# Patient Record
Sex: Female | Born: 2000 | Race: White | Hispanic: No | Smoking: Current every day smoker
Health system: Southern US, Community
[De-identification: ages and names within clinical notes are randomized; demographics above are authoritative.]

## PROBLEM LIST (undated history)

## (undated) ENCOUNTER — Emergency Department (HOSPITAL_COMMUNITY): Payer: Self-pay

## (undated) DIAGNOSIS — F909 Attention-deficit hyperactivity disorder, unspecified type: Secondary | ICD-10-CM

## (undated) DIAGNOSIS — L309 Dermatitis, unspecified: Secondary | ICD-10-CM

## (undated) DIAGNOSIS — F32A Depression, unspecified: Secondary | ICD-10-CM

## (undated) DIAGNOSIS — R6881 Early satiety: Secondary | ICD-10-CM

## (undated) DIAGNOSIS — F603 Borderline personality disorder: Secondary | ICD-10-CM

## (undated) DIAGNOSIS — F419 Anxiety disorder, unspecified: Secondary | ICD-10-CM

## (undated) DIAGNOSIS — R51 Headache: Secondary | ICD-10-CM

## (undated) DIAGNOSIS — R894 Abnormal immunological findings in specimens from other organs, systems and tissues: Secondary | ICD-10-CM

## (undated) DIAGNOSIS — F319 Bipolar disorder, unspecified: Secondary | ICD-10-CM

## (undated) DIAGNOSIS — T7840XA Allergy, unspecified, initial encounter: Secondary | ICD-10-CM

## (undated) DIAGNOSIS — J302 Other seasonal allergic rhinitis: Secondary | ICD-10-CM

## (undated) DIAGNOSIS — H539 Unspecified visual disturbance: Secondary | ICD-10-CM

## (undated) DIAGNOSIS — R110 Nausea: Secondary | ICD-10-CM

## (undated) DIAGNOSIS — F329 Major depressive disorder, single episode, unspecified: Secondary | ICD-10-CM

## (undated) DIAGNOSIS — F410 Panic disorder [episodic paroxysmal anxiety] without agoraphobia: Secondary | ICD-10-CM

## (undated) DIAGNOSIS — R109 Unspecified abdominal pain: Secondary | ICD-10-CM

## (undated) HISTORY — DX: Early satiety: R68.81

## (undated) HISTORY — DX: Bipolar disorder, unspecified: F31.9

## (undated) HISTORY — DX: Panic disorder (episodic paroxysmal anxiety): F41.0

## (undated) HISTORY — DX: Nausea: R11.0

## (undated) HISTORY — DX: Borderline personality disorder: F60.3

## (undated) HISTORY — PX: WISDOM TOOTH EXTRACTION: SHX21

## (undated) HISTORY — PX: ADENOIDECTOMY: SUR15

## (undated) HISTORY — DX: Unspecified abdominal pain: R10.9

## (undated) HISTORY — DX: Abnormal immunological findings in specimens from other organs, systems and tissues: R89.4

## (undated) HISTORY — PX: TONSILLECTOMY: SUR1361

---
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
## 2018-08-18 ENCOUNTER — Emergency Department (HOSPITAL_COMMUNITY)
Admission: EM | Admit: 2018-08-18 | Discharge: 2018-08-18 | Disposition: A | Discharge status: Home / Self Care | Payer: Medicaid Other | Attending: Emergency Medicine | Admitting: Emergency Medicine

## 2018-08-18 ENCOUNTER — Encounter (HOSPITAL_COMMUNITY): Payer: Self-pay | Admitting: Emergency Medicine

## 2018-08-18 ENCOUNTER — Other Ambulatory Visit: Payer: Self-pay

## 2018-08-18 DIAGNOSIS — F909 Attention-deficit hyperactivity disorder, unspecified type: Secondary | ICD-10-CM | POA: Insufficient documentation

## 2018-08-18 DIAGNOSIS — N938 Other specified abnormal uterine and vaginal bleeding: Secondary | ICD-10-CM | POA: Insufficient documentation

## 2018-08-18 DIAGNOSIS — F17210 Nicotine dependence, cigarettes, uncomplicated: Secondary | ICD-10-CM | POA: Insufficient documentation

## 2018-08-18 DIAGNOSIS — Z7982 Long term (current) use of aspirin: Secondary | ICD-10-CM | POA: Insufficient documentation

## 2018-08-18 DIAGNOSIS — N939 Abnormal uterine and vaginal bleeding, unspecified: Secondary | ICD-10-CM

## 2018-08-18 DIAGNOSIS — Z79899 Other long term (current) drug therapy: Secondary | ICD-10-CM | POA: Insufficient documentation

## 2018-08-18 LAB — PREGNANCY, URINE: Preg Test, Ur: NEGATIVE

## 2018-08-18 MED ORDER — MEDROXYPROGESTERONE ACETATE 5 MG PO TABS: 5.0000 mg | ORAL_TABLET | Freq: Every day | ORAL | 0 refills | Status: DC

## 2018-08-18 NOTE — ED Notes (Signed)
Patient is getting into a gown call bell in reach 

## 2018-08-18 NOTE — ED Notes (Signed)
Patient verbalizes understanding of discharge instructions. Opportunity for questioning and answers were provided. Armband removed by staff, pt discharged from ED.  

## 2018-08-18 NOTE — ED Triage Notes (Signed)
Pt here for vaginal bleeding on and of since 3/10. Pt states she had negative preg test then. Pt states not on BC, last normal period 6 months ago.

## 2018-08-19 NOTE — ED Provider Notes (Signed)
[facility] EMERGENCY DEPARTMENT Provider Note   CSN: [id number] Arrival date & time: 08/18/18  1133    History   Chief Complaint Chief Complaint  Patient presents with  . Vaginal Bleeding    HPI Mary Crane is a 17 y.o. female.     HPI   18yF with vaginal bleeding. Has been intermittent for weeks. About the same to less than a normal period typically. Not on BC. Some pelvic cramping. No discharge. No urinary complaints. Doesn't currently have gyn.   Past Medical History:  Diagnosis Date  . Abdominal pain   . Abdominal pain   . Abnormal celiac antibody panel   . ADHD (attention deficit hyperactivity disorder)   . ADHD (attention deficit hyperactivity disorder)    Hx: ADHD  . Allergy   . Anxiety   . Depression   . Early satiety   . Eczema    bilateral legs  . Headache(784.0)   . Nausea   . Nausea   . Seasonal allergies   . Vision abnormalities    Hx: of wears glasses    Patient Active Problem List   Diagnosis Date Noted  . 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 ADHD (attention deficit hyperactivity disorder) 01/19/2014  . Gluten intolerance 08/07/2013  . Abnormal celiac antibody panel     Past Surgical History:  Procedure Laterality Date  . ADENOIDECTOMY    . ESOPHAGOGASTRODUODENOSCOPY N/A 06/14/2013   Procedure: ESOPHAGOGASTRODUODENOSCOPY (EGD);  Surgeon: Jon Gills, MD;  Location: [facility] OR;  Service: Gastroenterology;  Laterality: N/A;  . TONSILLECTOMY    . WISDOM TOOTH EXTRACTION       OB History    Gravida  0   Para  0   Term  0   Preterm  0   AB  0   Living  0     SAB  0   TAB  0   Ectopic  0   Multiple  0   Live Births  0            Home Medications    Prior to Admission medications   Medication Sig Start Date End Date Taking? Authorizing Provider  aspirin-acetaminophen-caffeine (EXCEDRIN MIGRAINE) [phone] MG tablet Take 1 tablet by mouth every 6 (six) hours as needed for headache or  migraine.     [provider]  EPINEPHrine (EPIPEN 2-PAK) 0.3 mg/0.3 mL IJ SOAJ injection Inject 0.3 mg into the muscle once as needed (for allergic reaction).     [provider]  ibuprofen (ADVIL,MOTRIN) 200 MG tablet Take 400-600 mg by mouth every 6 (six) hours as needed (for pain).    [provider]  medroxyPROGESTERone (DEPO-PROVERA) 150 MG/ML injection Inject 1 mL (150 mg total) into the muscle every 3 (three) months. 12/20/16   Tresea Mall, CNM  medroxyPROGESTERone (PROVERA) 5 MG tablet Take 1 tablet (5 mg total) by mouth daily. 08/18/18   Raeford Razor, MD  methocarbamol (ROBAXIN) 500 MG tablet Take 1 tablet (500 mg total) by mouth 2 (two) times daily. Patient not taking: Reported on 08/23/2017 07/30/17   Dartha Lodge, PA-C  metroNIDAZOLE (FLAGYL) 500 MG tablet Take 1 tablet (500 mg total) by mouth 2 (two) times daily. Patient not taking: Reported on 07/30/2017 02/02/17   Liberty Handy, PA-C  naproxen (NAPROSYN) 500 MG tablet Take 1 tablet (500 mg total) by mouth 2 (two) times daily with a meal. 06/11/17   Muthersbaugh, Dahlia Client, PA-C  traMADol (ULTRAM) 50 MG tablet  Take 1 tablet (50 mg total) by mouth every 6 (six) hours as needed. 08/21/17   Liberty HandyGibbons, Claudia J, PA-C    Family History Family History  Problem Relation Age of Onset  . Cholelithiasis Maternal Grandmother   . Hypertension Maternal Grandmother   . Hyperlipidemia Maternal Grandmother   . Thyroid disease Maternal Grandmother   . Cholelithiasis Maternal Grandfather   . Hypertension Maternal Grandfather   . Celiac disease Neg Hx   . Hyperlipidemia Mother   . Thyroid disease Mother     Social History Social History   Tobacco Use  . Smoking status: Current Every Day Smoker    Packs/day: 0.50    Years: 1.50    Pack years: 0.75    Types: Cigarettes  . Smokeless tobacco: Never Used  Substance Use Topics  . Alcohol use: No  . Drug use: No     Allergies   Azithromycin; Augmentin  [amoxicillin-pot clavulanate]; and Gluten meal   Review of Systems Review of Systems  All systems reviewed and negative, other than as noted in HPI. Physical Exam Updated Vital Signs BP 134/90 (BP Location: Right Arm)   Pulse 92   Temp 98.6 F (37 C) (Oral)   Resp 20   SpO2 99%   Physical Exam Vitals signs and nursing note reviewed.  Constitutional:      General: She is not in acute distress.    Appearance: She is well-developed.  HENT:     Head: Normocephalic and atraumatic.  Eyes:     General:        Right eye: No discharge.        Left eye: No discharge.     Conjunctiva/sclera: Conjunctivae normal.  Neck:     Musculoskeletal: Neck supple.  Cardiovascular:     Rate and Rhythm: Normal rate and regular rhythm.     Heart sounds: Normal heart sounds. No murmur. No friction rub. No gallop.   Pulmonary:     Effort: Pulmonary effort is normal. No respiratory distress.     Breath sounds: Normal breath sounds.  Abdominal:     General: There is no distension.     Palpations: Abdomen is soft.     Tenderness: There is no abdominal tenderness.  Musculoskeletal:        General: No tenderness.  Skin:    General: Skin is warm and dry.  Neurological:     Mental Status: She is alert.  Psychiatric:        Behavior: Behavior normal.        Thought Content: Thought content normal.      ED Treatments / Results  Labs (all labs ordered are listed, but only abnormal results are displayed) Labs Reviewed  PREGNANCY, URINE    EKG None  Radiology No results found.  Procedures Procedures (including critical care time)  Medications Ordered in ED Medications - No data to display   Initial Impression / Assessment and Plan / ED Course  I have reviewed the triage vital signs and the nursing notes.  Pertinent labs & imaging results that were available during my care of the patient were reviewed by me and considered in my medical decision making (see chart for details).         Will try on provera. Benign exam. Not pregnant. Needs to establish with GYN. It has been determined that no acute conditions requiring further emergency intervention are present at this time. The patient has been advised of the diagnosis and plan. I reviewed  any labs and imaging including any potential incidental findings. I have reviewed nursing notes and appropriate previous records. We have discussed signs and symptoms that warrant return to the ED and they are listed in the discharge instructions.      Final Clinical Impressions(s) / ED Diagnoses   Final diagnoses:  Vaginal bleeding    ED Discharge Orders         Ordered    medroxyPROGESTERone (PROVERA) 5 MG tablet  Daily,   Status:  Discontinued     08/18/18 1251    medroxyPROGESTERone (PROVERA) 5 MG tablet  Daily     08/18/18 1251           Raeford Razor, MD 08/19/18 1755
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
## 2019-09-01 ENCOUNTER — Emergency Department (HOSPITAL_COMMUNITY)
Admission: EM | Admit: 2019-09-01 | Discharge: 2019-09-01 | Disposition: A | Discharge status: Home / Self Care | Payer: Medicaid Other | Attending: Emergency Medicine | Admitting: Emergency Medicine

## 2019-09-01 ENCOUNTER — Encounter (HOSPITAL_COMMUNITY): Payer: Self-pay | Admitting: Emergency Medicine

## 2019-09-01 ENCOUNTER — Other Ambulatory Visit: Payer: Self-pay

## 2019-09-01 DIAGNOSIS — Z5321 Procedure and treatment not carried out due to patient leaving prior to being seen by health care provider: Secondary | ICD-10-CM | POA: Insufficient documentation

## 2019-09-01 DIAGNOSIS — M79645 Pain in left finger(s): Secondary | ICD-10-CM | POA: Insufficient documentation

## 2019-09-01 NOTE — ED Triage Notes (Signed)
Pt states she bent acrylic nail on L thumb back when she lost the grip on a couch she was moving.  C/o bleeding around nail bed.

## 2019-09-02 DIAGNOSIS — R894 Abnormal immunological findings in specimens from other organs, systems and tissues: Secondary | ICD-10-CM | POA: Insufficient documentation

## 2020-03-19 ENCOUNTER — Encounter: Payer: Medicaid Other | Admitting: Obstetrics and Gynecology

## 2020-03-19 ENCOUNTER — Other Ambulatory Visit: Payer: Self-pay

## 2020-03-19 ENCOUNTER — Ambulatory Visit (INDEPENDENT_AMBULATORY_CARE_PROVIDER_SITE_OTHER): Payer: Medicaid Other

## 2020-03-19 DIAGNOSIS — N926 Irregular menstruation, unspecified: Secondary | ICD-10-CM | POA: Diagnosis not present

## 2020-03-19 LAB — POCT URINE PREGNANCY: Preg Test, Ur: POSITIVE — AB

## 2020-03-19 NOTE — Progress Notes (Signed)
Patient was originally scheduled with [facility] for irregular cycles. Patient mentioned she had a positive UPT at home this morning. Did a UPT in office and was positive. Appointment with [facility] cancelled (per [facility]) and pt to schedule NOB visit.

## 2020-03-20 NOTE — Progress Notes (Signed)
This encounter was created in error - please disregard.

## 2020-03-20 NOTE — Patient Instructions (Signed)
I value your feedback and entrusting us with your care. If you get a [facility] patient survey, I would appreciate you taking the time to let us know about your experience today. Thank you!  As of April 25, 2019, your lab results will be released to your MyChart immediately, before I even have a chance to see them. Please give me time to review them and contact you if there are any abnormalities. Thank you for your patience.  

## 2020-04-06 ENCOUNTER — Encounter: Payer: Self-pay | Admitting: Obstetrics

## 2020-04-06 ENCOUNTER — Other Ambulatory Visit: Payer: Self-pay

## 2020-04-06 ENCOUNTER — Other Ambulatory Visit (HOSPITAL_COMMUNITY)
Admission: RE | Admit: 2020-04-06 | Discharge: 2020-04-06 | Disposition: A | Discharge status: Home / Self Care | Payer: Medicaid Other | Source: Ambulatory Visit | Attending: Obstetrics | Admitting: Obstetrics

## 2020-04-06 ENCOUNTER — Ambulatory Visit (INDEPENDENT_AMBULATORY_CARE_PROVIDER_SITE_OTHER): Payer: Medicaid Other | Admitting: Obstetrics

## 2020-04-06 VITALS — BP 120/60 | Wt 144.0 lb

## 2020-04-06 DIAGNOSIS — O0990 Supervision of high risk pregnancy, unspecified, unspecified trimester: Secondary | ICD-10-CM | POA: Insufficient documentation

## 2020-04-06 DIAGNOSIS — Z3A12 12 weeks gestation of pregnancy: Secondary | ICD-10-CM | POA: Diagnosis not present

## 2020-04-06 DIAGNOSIS — Z3401 Encounter for supervision of normal first pregnancy, first trimester: Secondary | ICD-10-CM

## 2020-04-06 DIAGNOSIS — F1290 Cannabis use, unspecified, uncomplicated: Secondary | ICD-10-CM

## 2020-04-06 HISTORY — DX: Supervision of high risk pregnancy, unspecified, unspecified trimester: O09.90

## 2020-04-06 LAB — POCT URINALYSIS DIPSTICK OB
Glucose, UA: NEGATIVE
POC,PROTEIN,UA: NEGATIVE

## 2020-04-06 LAB — POCT URINE PREGNANCY: Preg Test, Ur: POSITIVE — AB

## 2020-04-06 NOTE — Progress Notes (Signed)
C/O irreg periods; coming off seroquel and side effects while preg.rj

## 2020-04-06 NOTE — Progress Notes (Signed)
New Obstetric Patient H&P    Chief Complaint: "Desires prenatal care"   History of Present Illness: Patient is a 19 y.o. G1P0000 Not Hispanic or Latino female, LMP uncertain presents with amenorrhea and positive home pregnancy test. Based on her  LMP, her EDD is Estimated Date of Delivery: 10/15/20 and her EGA is [redacted]w[redacted]d Cycles are 5. days, irregular, and occur approximately every : not applicable days. Her last pap smear was uncertain, and she is not sure whether she has ever had a pap.    She had a urine pregnancy test which was positive about 4 week(s)  ago. Her last menstrual period was normal and lasted for  about 4 day(s). Since her LMP she claims she has experienced nausea, breast tenderness.. She denies vaginal bleeding. Her past medical history is noncontributory. Her prior pregnancies are notable for none  Since her LMP, she admits to the use of tobacco products  Yes- She is trying to cut bac , but smokes 1/2 ppd, and also smoked MJ yesterday. She claims she has gained   no pounds since the start of her pregnancy.  There are cats in the home in the home  yes If yes Indoor She admits close contact with children on a regular basis  no  She has had chicken pox in the past no She has had Tuberculosis exposures, symptoms, or previously tested positive for TB   no Current or past history of domestic violence. no  Genetic Screening/Teratology Counseling: (Includes patient, baby's father, or anyone in either family with:)   [facility] 2. Thalassemia ([place] or [place] background): MCV<80  no 3. Neural tube defect (meningomyelocele, spina bifida, anencephaly)  no 4. Congenital heart defect  no  5. Down syndrome  no 6. Tay-Sachs ([place], [place]  no 7. Canavan's Disease  no 8. Sickle cell disease or trait (African)  no  9. Hemophilia or other blood disorders  no  10. Muscular dystrophy  no  11. Cystic fibrosis  no  12.  Huntington's Chorea  no  13. Mental retardation/autism  no 14. Other inherited genetic or chromosomal disorder  no 15. Maternal metabolic disorder (DM, PKU, etc)  no 16. Patient or FOB with a child with a birth defect not listed above no  16a. Patient or FOB with a birth defect themselves no 17. Recurrent pregnancy loss, or stillbirth  no  18. Any medications since LMP other than prenatal vitamins (include vitamins, supplements, OTC meds, drugs, alcohol)  Yes. She takes daily Seroquel and Lamictal. Recently tried to come off of the Seroquel, and had trrible side effects. 19. Any other genetic/environmental exposure to discuss  no  Infection History:   1. Lives with someone with TB or TB exposed  no  2. Patient or partner has history of genital herpes  She is uncertain. Would like testing for this. 3. Rash or viral illness since LMP  no 4. History of STI (GC, CT, HPV, syphilis, HIV)  no 5. History of recent travel :  no  Other pertinent information:  yes . Extensive psych hx- presently under care of an FNP for medication. Is seeking help from psychiatry. We will refer her to MFM for a consultation per her request.    Review of Systems:10 point review of systems negative unless otherwise noted in HPI  Past Medical History:  Past Medical History:  Diagnosis Date  . Abdominal pain   .  Abdominal pain   . Abnormal celiac antibody panel   . ADHD (attention deficit hyperactivity disorder)   . ADHD (attention deficit hyperactivity disorder)    Hx: ADHD  . Allergy   . Anxiety   . Bipolar depression ([place])   . Borderline personality disorder ([place])   . Depression   . Early satiety   . Eczema    bilateral legs  . Headache(784.0)   . Nausea   . Nausea   . Panic disorder   . Seasonal allergies   . Vision abnormalities    Hx: of wears glasses    Past Surgical History:  Past Surgical History:  Procedure Laterality Date  . ADENOIDECTOMY    . ESOPHAGOGASTRODUODENOSCOPY N/A 06/14/2013    Procedure: ESOPHAGOGASTRODUODENOSCOPY (EGD);  Surgeon: Oletha Blend, MD;  Location: [facility];  Service: Gastroenterology;  Laterality: N/A;  . TONSILLECTOMY    . WISDOM TOOTH EXTRACTION      Gynecologic History: Patient's last menstrual period was 01/09/2020 (exact date).  Obstetric History: G1P0000  Family History:  Family History  Problem Relation Age of Onset  . Cholelithiasis Maternal Grandmother   . Hypertension Maternal Grandmother   . Hyperlipidemia Maternal Grandmother   . Thyroid disease Maternal Grandmother   . Cholelithiasis Maternal Grandfather   . Hypertension Maternal Grandfather   . Celiac disease Neg Hx   . Hyperlipidemia Mother   . Thyroid disease Mother     Social History:  Social History   Socioeconomic History  . Marital status: Single    Spouse name: Not on file  . Number of children: Not on file  . Years of education: Not on file  . Highest education level: Not on file  Occupational History  . Not on file  Tobacco Use  . Smoking status: Current Every Day Smoker    Packs/day: 0.50    Years: 1.50    Pack years: 0.75    Types: Cigarettes  . Smokeless tobacco: Never Used  Vaping Use  . Vaping Use: Former  Substance and Sexual Activity  . Alcohol use: No  . Drug use: Yes    Types: Marijuana  . Sexual activity: Yes    Birth control/protection: None  Other Topics Concern  . Not on file  Social History Narrative   ** Merged History Encounter **       9th grade 2014-2015   Social Determinants of Health   Financial Resource Strain:   . Difficulty of Paying Living Expenses: Not on file  Food Insecurity:   . Worried About Charity fundraiser in the Last Year: Not on file  . Ran Out of Food in the Last Year: Not on file  Transportation Needs:   . Lack of Transportation (Medical): Not on file  . Lack of Transportation (Non-Medical): Not on file  Physical Activity:   . Days of Exercise per Week: Not on file  . Minutes of Exercise per  Session: Not on file  Stress:   . Feeling of Stress : Not on file  Social Connections:   . Frequency of Communication with Friends and Family: Not on file  . Frequency of Social Gatherings with Friends and Family: Not on file  . Attends Religious Services: Not on file  . Active Member of Clubs or Organizations: Not on file  . Attends Archivist Meetings: Not on file  . Marital Status: Not on file  Intimate Partner Violence:   . Fear of Current or Ex-Partner: Not on file  .  Emotionally Abused: Not on file  . Physically Abused: Not on file  . Sexually Abused: Not on file    Allergies:  Allergies  Allergen Reactions  . Azithromycin Anaphylaxis, Hives, Other (See Comments) and Itching    Trouble breathing Trouble breathing  . Augmentin [Amoxicillin-Pot Clavulanate] Hives  . Gluten Meal Nausea And Vomiting    Medications: Prior to Admission medications   Medication Sig Start Date End Date Taking? Authorizing Provider  acetaminophen (TYLENOL) 500 MG tablet Take 500 mg by mouth as needed.   Yes [provider]  lamoTRIgine (LAMICTAL ODT) 25 & 50 & 100 MG KIT Take 50 mg by mouth daily.   Yes [provider]  QUEtiapine (SEROQUEL) 300 MG tablet Take 1 tablet by mouth daily.   Yes [provider]  aspirin-acetaminophen-caffeine (EXCEDRIN MIGRAINE) [phone] MG tablet Take 1 tablet by mouth every 6 (six) hours as needed for headache or migraine.  Patient not taking: Reported on 04/06/2020    [provider]  ibuprofen (ADVIL,MOTRIN) 200 MG tablet Take 400-600 mg by mouth every 6 (six) hours as needed (for pain). Patient not taking: Reported on 04/06/2020    [provider]  medroxyPROGESTERone (DEPO-PROVERA) 150 MG/ML injection Inject 1 mL (150 mg total) into the muscle every 3 (three) months. Patient not taking: Reported on 04/06/2020 12/20/16   Rod Can, CNM  medroxyPROGESTERone (PROVERA) 5 MG tablet Take 1 tablet (5 mg total)  by mouth daily. Patient not taking: Reported on 04/06/2020 08/18/18   Virgel Manifold, MD  methocarbamol (ROBAXIN) 500 MG tablet Take 1 tablet (500 mg total) by mouth 2 (two) times daily. Patient not taking: Reported on 08/23/2017 07/30/17   Jacqlyn Larsen, PA-C  metroNIDAZOLE (FLAGYL) 500 MG tablet Take 1 tablet (500 mg total) by mouth 2 (two) times daily. Patient not taking: Reported on 07/30/2017 02/02/17   Kinnie Feil, PA-C  naproxen (NAPROSYN) 500 MG tablet Take 1 tablet (500 mg total) by mouth 2 (two) times daily with a meal. Patient not taking: Reported on 04/06/2020 06/11/17   Muthersbaugh, Jarrett Soho, PA-C  traMADol (ULTRAM) 50 MG tablet Take 1 tablet (50 mg total) by mouth every 6 (six) hours as needed. Patient not taking: Reported on 04/06/2020 08/21/17   Kinnie Feil, PA-C    Physical Exam Vitals: Blood pressure 120/60, weight 144 lb (65.3 kg), last menstrual period 01/09/2020.  General: NAD HEENT: normocephalic, anicteric Thyroid: no enlargement, no palpable nodules Pulmonary: No increased work of breathing, CTAB Cardiovascular: RRR, distal pulses 2+ Abdomen: NABS, soft, non-tender, non-distended.  Umbilicus without lesions.  No hepatomegaly, splenomegaly or masses palpable. No evidence of hernia  Genitourinary:  External: Normal external female genitalia.  Normal urethral meatus, normal  Bartholin's and Skene's glands.    Vagina: Normal vaginal mucosa, no evidence of prolapse.    Cervix: Grossly normal in appearance, no bleeding  Uterus: enlarged, mobile, normal contour.  No CMT  Adnexa: ovaries non-enlarged, no adnexal masses  Rectal: deferred Extremities: no edema, erythema, or tenderness Neurologic: Grossly intact Psychiatric: mood appropriate, affect full   Assessment: 19 y.o. G1P0000 at 22w4dpresenting to initiate prenatal care  Plan: 1) Avoid alcoholic beverages. 2) Patient encouraged not to smoke.  3) Discontinue the use of all non-medicinal drugs and  chemicals.  4) Take prenatal vitamins daily.  5) Nutrition, food safety (fish, cheese advisories, and high nitrite foods) and exercise discussed. 6) Hospital and practice style discussed with cross coverage system.  7) Genetic Screening, such as with 1st  Trimester Screening, cell free fetal DNA, AFP testing, and Ultrasound, as well as with amniocentesis and CVS as appropriate, is discussed with patient. At the conclusion of today's visit patient undecided genetic testing. She would desie the MaternT testing if offered. Printed information provided. 8) Patient is asked about travel to areas at risk for the Zika virus, and counseled to avoid travel and exposure to mosquitoes or sexual partners who may have themselves been exposed to the virus. Testing is discussed, and will be ordered as appropriate.   Strongly advised to quite smoking MJ and cigarettes. Described the risks to her baby (very desired pregnancy) from tobacco and unregulated MJ. Lengthy discussion around her  Psych medication. Her Seroquel is Cat C, and we discussed this. I have told her to continue this medication, and we can consult with MFM.  Placed her on the High Risk list.

## 2020-04-07 LAB — CYTOLOGY - PAP
Chlamydia: NEGATIVE
Comment: NEGATIVE
Comment: NEGATIVE
Comment: NORMAL
Diagnosis: NEGATIVE
Neisseria Gonorrhea: NEGATIVE
Trichomonas: NEGATIVE

## 2020-04-08 LAB — URINE CULTURE: Organism ID, Bacteria: NO GROWTH

## 2020-04-13 LAB — TOXASSURE SELECT 13 (MW), URINE

## 2020-04-14 ENCOUNTER — Encounter: Payer: Self-pay | Admitting: Obstetrics

## 2020-04-14 DIAGNOSIS — F1290 Cannabis use, unspecified, uncomplicated: Secondary | ICD-10-CM | POA: Insufficient documentation

## 2020-04-20 ENCOUNTER — Ambulatory Visit (INDEPENDENT_AMBULATORY_CARE_PROVIDER_SITE_OTHER): Payer: Medicaid Other

## 2020-04-20 ENCOUNTER — Other Ambulatory Visit: Payer: Medicaid Other

## 2020-04-20 ENCOUNTER — Other Ambulatory Visit: Payer: Self-pay | Admitting: Obstetrics

## 2020-04-20 ENCOUNTER — Ambulatory Visit (INDEPENDENT_AMBULATORY_CARE_PROVIDER_SITE_OTHER): Payer: Medicaid Other | Admitting: Obstetrics

## 2020-04-20 ENCOUNTER — Other Ambulatory Visit: Payer: Self-pay

## 2020-04-20 VITALS — BP 104/60 | Wt 143.0 lb

## 2020-04-20 DIAGNOSIS — Z3401 Encounter for supervision of normal first pregnancy, first trimester: Secondary | ICD-10-CM | POA: Diagnosis not present

## 2020-04-20 DIAGNOSIS — F908 Attention-deficit hyperactivity disorder, other type: Secondary | ICD-10-CM

## 2020-04-20 DIAGNOSIS — Z3A12 12 weeks gestation of pregnancy: Secondary | ICD-10-CM

## 2020-04-20 DIAGNOSIS — O0990 Supervision of high risk pregnancy, unspecified, unspecified trimester: Secondary | ICD-10-CM

## 2020-04-20 DIAGNOSIS — Z3A09 9 weeks gestation of pregnancy: Secondary | ICD-10-CM

## 2020-04-20 DIAGNOSIS — Z3A14 14 weeks gestation of pregnancy: Secondary | ICD-10-CM

## 2020-04-20 LAB — POCT URINALYSIS DIPSTICK OB
Glucose, UA: NEGATIVE
POC,PROTEIN,UA: NEGATIVE

## 2020-04-20 NOTE — Addendum Note (Signed)
Addended by: Loran Senters D on: 04/20/2020 12:03 PM   Modules accepted: Orders

## 2020-04-20 NOTE — Progress Notes (Signed)
Routine Prenatal Care Visit  Subjective  Mary Crane is a 19 y.o. G1P0000 at [redacted]w[redacted]d being seen today for ongoing prenatal care.  She is currently monitored for the following issues for this high-risk pregnancy and has Abnormal celiac antibody panel; Gluten intolerance; Anaphylactic reaction; ADHD (attention deficit hyperactivity disorder); Supervision of high risk pregnancy, antepartum; and Marijuana user on their problem list.  ----------------------------------------------------------------------------------- Patient reports nausea.  She continues on her Seroquel and Lamictal. Would like to talk to MFM about this.  .  .   Pincus Large Fluid denies.  ----------------------------------------------------------------------------------- The following portions of the patient's history were reviewed and updated as appropriate: allergies, current medications, past family history, past medical history, past social history, past surgical history and problem list. Problem list updated.  Objective  Blood pressure 104/60, weight 143 lb (64.9 kg), last menstrual period 01/09/2020. Pregravid weight 144 lb (65.3 kg) Total Weight Gain -1 lb (-0.454 kg) Urinalysis: Urine Protein    Urine Glucose    Fetal Status:           General:  Alert, oriented and cooperative. Patient is in no acute distress.  Skin: Skin is warm and dry. No rash noted.   Cardiovascular: Normal heart rate noted  Respiratory: Normal respiratory effort, no problems with respiration noted  Abdomen: Soft, gravid, appropriate for gestational age.       Pelvic:  Cervical exam deferred        Extremities: Normal range of motion.     Mental Status: Normal mood and affect. Normal behavior. Normal judgment and thought content.   Assessment   19 y.o. G1P0000 at [redacted]w[redacted]d by  11/23/2020, by Ultrasound presenting for routine prenatal visit  Plan   FIRST Problems (from 04/06/20 to present)    Problem Noted Resolved   Marijuana user  04/14/2020 by Mirna Mires, CNM No   Supervision of high risk pregnancy, antepartum 04/06/2020 by Mirna Mires, CNM No   Overview Addendum 04/10/2020  8:38 AM by Mirna Mires, CNM     Clinic  Prenatal Labs  Dating  Blood type:     Genetic Screen 1 Screen:    AFP:     Quad:     NIPS: Antibody:   Anatomic [place]  Rubella:    GTT Early:               Third trimester:  RPR:     Flu vaccine  HBsAg:     TDaP vaccine                                               Rhogam: HIV:     Baby Food                                               GBS: (For PCN allergy, check sensitivities)  Contraception  GLO:VFIEPPIR 04/06/20  NILM  Circumcision    Pediatrician Fiance Lucious Groves.   Support Person              Previous Version       Preterm labor symptoms and general obstetric precautions including but not limited to vaginal bleeding, contractions, leaking of fluid and fetal movement were reviewed in detail with the  patient. Please refer to After Visit Summary for other counseling recommendations.  Reviewed her dating scan- there is a change of EDD. Bloodwork drawn today. Will make referral to MFM to discuss hr mood medication.  Return in about 4 weeks (around 05/18/2020) for return OB, anatomy scan.  Mirna Mires, CNM  04/20/2020 11:49 AM

## 2020-04-20 NOTE — Progress Notes (Signed)
No concerns.rj 

## 2020-04-22 LAB — RPR+RH+ABO+RUB AB+AB SCR+CB...
Antibody Screen: NEGATIVE
HIV Screen 4th Generation wRfx: NONREACTIVE
Hematocrit: 40 % (ref 34.0–46.6)
Hemoglobin: 13.8 g/dL (ref 11.1–15.9)
Hepatitis B Surface Ag: NEGATIVE
MCH: 31.7 pg (ref 26.6–33.0)
MCHC: 34.5 g/dL (ref 31.5–35.7)
MCV: 92 fL (ref 79–97)
Platelets: 379 10*3/uL (ref 150–450)
RBC: 4.35 x10E6/uL (ref 3.77–5.28)
RDW: 13.1 % (ref 11.7–15.4)
RPR Ser Ql: NONREACTIVE
Rh Factor: NEGATIVE
Rubella Antibodies, IGG: 9.39 index (ref >0.99)
Varicella zoster IgG: 253 index (ref >165)
WBC: 13.3 10*3/uL — ABNORMAL HIGH (ref 3.4–10.8)

## 2020-04-22 LAB — HSV(HERPES SMPLX)ABS-I+II(IGG+IGM)-BLD
HSV 1 Glycoprotein G Ab, IgG: 41 index — ABNORMAL HIGH (ref 0.00–0.90)
HSV 2 IgG, Type Spec: <0.91 index (ref 0.00–0.90)
HSVI/II Comb IgM: <0.91 Ratio (ref 0.00–0.90)

## 2020-04-30 ENCOUNTER — Other Ambulatory Visit: Payer: Self-pay

## 2020-04-30 ENCOUNTER — Ambulatory Visit: Payer: Medicaid Other | Attending: Obstetrics | Admitting: Obstetrics

## 2020-04-30 DIAGNOSIS — O99331 Smoking (tobacco) complicating pregnancy, first trimester: Secondary | ICD-10-CM

## 2020-04-30 DIAGNOSIS — F1290 Cannabis use, unspecified, uncomplicated: Secondary | ICD-10-CM | POA: Diagnosis not present

## 2020-04-30 DIAGNOSIS — O355XX0 Maternal care for (suspected) damage to fetus by drugs, not applicable or unspecified: Secondary | ICD-10-CM

## 2020-04-30 DIAGNOSIS — Z8279 Family history of other congenital malformations, deformations and chromosomal abnormalities: Secondary | ICD-10-CM

## 2020-04-30 DIAGNOSIS — O0990 Supervision of high risk pregnancy, unspecified, unspecified trimester: Secondary | ICD-10-CM

## 2020-04-30 DIAGNOSIS — F17200 Nicotine dependence, unspecified, uncomplicated: Secondary | ICD-10-CM

## 2020-04-30 DIAGNOSIS — Z3A10 10 weeks gestation of pregnancy: Secondary | ICD-10-CM

## 2020-04-30 DIAGNOSIS — O99321 Drug use complicating pregnancy, first trimester: Secondary | ICD-10-CM

## 2020-04-30 DIAGNOSIS — O09891 Supervision of other high risk pregnancies, first trimester: Secondary | ICD-10-CM

## 2020-04-30 NOTE — Progress Notes (Addendum)
Referring Physician:  Length of consultation:  Mary Crane was referred to [facility] at [facility] for genetic counseling to discuss possible risks to her fetus as a result of her exposure to multiple medications and to review testing options for this pregnancy.    Medication exposures: Prior to reviewing her specific exposures, we reviewed the general principles of teratogenic agents (substances that cause birth defects).  First, every pregnancy carries the risk for major or minor malformations of approximately 2-3%.  To show that an agent (exposure) is teratogenic, the rate of abnormalities for exposed pregnancies must be greater than that expected due to the background risk.  The dosage and timing of an exposure are also very important, with the first trimester of pregnancy often being the most critical.  Mary Crane reported that she is prescribed Lamictal (50mg  daily) and Seroquel (200mg  daily) by her mental health provider.  She feels like this regimen, along with counseling, is working really well for her at this time to manage her bipolar, borderline personality disorder and anxiety.  We discussed the importance of balancing maternal wellbeing with any possible risks to the pregnancy.  Overall, maintaining her stable mental health is the best thing that she can do to contribute to having a healthy baby.  The patient denied the use of alcohol in this pregnancy. Prior to learning that she was pregnant, she was smoking marijuana and approximately 1 pack per day of tobacco.  She has since cut back to about 1/2 pack of cigarettes per day.  We reviewed data on each medication separately, as follows:  Lamictal - While an increase in oral clefts (cleft lip and/or cleft palate) has been suggested in some studies, other registries have not confirmed this association.  There does not appear to be an increase in the number or types of other birth defects with  Lamictal.  Seroquel - Studies on this medication in pregnancy have not shown an increase in the number of type of birth defects, though the use of antipsychotic medications in the third trimester have been associated with transient neonatal extrapyramidal and/or withdrawal symptoms after delivery.  Tobacco - Smoking during pregnancy has been associated with low birth weight, premature delivery and pregnancy loss.  For this reason, we suggest that she continue to cut back or avoid smoking for the remainder of the pregnancy.  We also explained that while there is data available on many individual medications during pregnancy, it is not possible to assess the combined effects of multiple medications.  In general, it is best to be on the fewest number of medications at the lowest dose possible to be effective.  As with all medications prescribed during pregnancy, the risk to the fetus must be weighed against the benefit to the mother.  Therefore, it is important for each patient to consult with her healthcare providers about any concerns she may have regarding continuing medication use.  Family history: During our session, we also had the opportunity to obtain a detailed pregnancy and family history. We reviewed possible inherited factors in the development of mental health conditions, though no specific genetic testing is currently available to determine who may be at risk.  The father of the baby, , reported maternal first cousin with Down syndrome.  Down syndrome is caused by an extra copy of the genetic instructions located on chromosome number 21.  These extra instructions result in the characteristic facial appearance, intellectual disabilities and an increased risk for some types of  birth defects.  The majority (95%) of persons with Down syndrome have three freestanding copies of chromosome number 21, called trisomy 6621.  This type of Down syndrome occurs as a sporadic condition and does not  increase the chance for other family members to have Down syndrome.  Rarely, Down syndrome is caused by a rearrangement of the genetic instructions, where the extra chromosome number 21 is attached to another chromosome.  This type of chromosome rearrangement can be passed down through families and therefore may increase the chance for Down syndrome in other family members.  We cannot determine which type of Down syndrome is present without documentation of chromosome studies in the affected family member.  If any additional information is obtained about this history, please do not hesitate to contact us. The screening tests we reviewed are able to detect either type of Down syndrome. The remainder of the family history appeared negative for birth defects, developmental delays or known genetic conditions.  [name] reported that this is the first pregnancy for both she and her partner.  Routine screening options: We made available the following routine screening and testing options for this pregnancy. Chromosomes and examples of chromosome problems were reviewed.  Humans typically have 46 chromosomes in each cell, with half passed through each sperm and egg.  Any change in the number or structure of chromosomes can increase the risk of problems in the physical and mental development of a pregnancy.   The most accurate noninvasive screening option for chromosome conditions is cell free fetal DNA.  This test utilizes a maternal blood sample and DNA sequencing technology to isolate circulating cell free fetal DNA from maternal plasma.  The fetal DNA can then be analyzed for DNA sequences that are derived from the three most common chromosomes involved in aneuploidy, chromosomes 13, 18, and 21.  If the overall amount of DNA is greater than the expected level for any of these chromosomes, aneuploidy is suspected.  While we do not consider it a replacement for invasive testing and karyotype analysis, a  negative result from this testing would be reassuring, though not a guarantee of a normal chromosome complement for the baby.  An abnormal result is certainly suggestive of an abnormal chromosome complement, though we would still recommend CVS or amniocentesis to confirm any findings from this testing. This test can detect >99% of babies with Down syndrome and trisomy 18 and >91% of babies with trisomy 5513.  First trimester screening, which includes nuchal translucency ultrasound screen and first trimester maternal serum marker screening is another option for screening.  The nuchal translucency has approximately an 80% detection rate for Down syndrome and can be positive for other chromosome abnormalities as well as congenital heart defects.  When combined with a maternal serum marker screening, the detection rate is up to 90% for Down syndrome and up to 97% for trisomy 18.     Maternal serum marker screening, a blood test that measures pregnancy proteins, can provide risk assessments for Down syndrome, trisomy 18, and open neural tube defects (spina bifida, anencephaly). Because it does not directly examine the fetus, it cannot positively diagnose or rule out these problems.  Targeted ultrasound uses high frequency sound waves to create an image of the developing fetus.  An ultrasound is often recommended as a routine means of evaluating the pregnancy.  It is also used to screen for fetal anatomy problems (for example, a heart defect) that might be suggestive of a chromosomal or other  abnormality.   Should these screening tests indicate an increased concern, then the following additional testing options would be offered:  The chorionic villus sampling procedure is available for first trimester chromosome analysis.  This involves the withdrawal of a small amount of chorionic villi (tissue from the developing placenta).  Risk of pregnancy loss is estimated to be approximately 1 in 200 to 1 in 100 (0.5 to  1%).  There is approximately a 1% (1 in 100) chance that the CVS chromosome results will be unclear.  Chorionic villi cannot be tested for neural tube defects.     Amniocentesis involves the removal of a small amount of amniotic fluid from the sac surrounding the fetus with the use of a thin needle inserted through the maternal abdomen and uterus.  Ultrasound guidance is used throughout the procedure.  Fetal cells from amniotic fluid are directly evaluated and > 99.5% of chromosome problems and > 98% of open neural tube defects can be detected. This procedure is generally performed after the 15th week of pregnancy.  The main risks to this procedure include complications leading to miscarriage in less than 1 in 200 cases (0.5%).  Cystic Fibrosis and Spinal Muscular Atrophy (SMA) screening were also discussed with the patient. Both conditions are recessive, which means that both parents must be carriers in order to have a child with the disease.  Cystic fibrosis (CF) is one of the most common genetic conditions in persons of Caucasian ancestry.  This condition occurs in approximately 1 in 2,500 Caucasian persons and results in thickened secretions in the lungs, digestive, and reproductive systems.  For a baby to be at risk for having CF, both of the parents must be carriers for this condition.  Approximately 1 in 92 Caucasian persons is a carrier for CF.  Current carrier testing looks for the most common mutations in the gene for CF and can detect approximately 90% of carriers in the Caucasian population.  This means that the carrier screening can greatly reduce, but cannot eliminate, the chance for an individual to have a child with CF.  If an individual is found to be a carrier for CF, then carrier testing would be available for the partner. As part of [place]'s newborn screening profile, all babies born in the state of [place] will have a two-tier screening process.  Specimens are first tested to  determine the concentration of immunoreactive trypsinogen (IRT).  The top 5% of specimens with the highest IRT values then undergo DNA testing using a panel of over 40 common CF mutations. SMA is a neurodegenerative disorder that leads to atrophy of skeletal muscle and overall weakness.  This condition is also more prevalent in the Caucasian population, with 1 in 40-1 in 60 persons being a carrier and 1 in 6,000-1 in 10,000 children being affected.  There are multiple forms of the disease, with some causing death in infancy to other forms with survival into adulthood.  The genetics of SMA is complex, but carrier screening can detect up to 95% of carriers in the Caucasian population.  Similar to CF, a negative result can greatly reduce, but cannot eliminate, the chance to have a child with SMA.  Plan of care:  The patient desires MaterniT21 PLUS with SCA to be drawn at her next OB visit with [facility]  She is still considering if she would like carrier screening for CF, SMA and hemoglobinopathies drawn at that time as well  Mary Crane is scheduled to return  to [facility] [place] at [redacted] weeks gestation for a detailed anatomy ultrasound  A fetal echocardiogram after 22 weeks is also recommended due to the medication exposures   The patient was encouraged to contact us with any questions or concerns.  We can be reached at [phone].  Cherly Anderson, MS, CGC  Mary Crane is currently being treated with Lamictal and Seroquel to help with the treatment of her bipolar/borderline personality disorder and anxiety.  She reports that she has been treated with these medications for multiple years and would not be able to function normally without taking these two medications.  She understands that there have been some reports of associated birth defects such as cleft lip or cleft palate associated with Lamictal use, however, the overall risk of these birth defects is  probably low.    We will perform a detailed fetal anatomy scan at around 19 weeks to screen for fetal anomalies and will refer her to pediatric cardiology for a fetal echocardiogram to rule out any cardiac defects.  We will then schedule a growth ultrasound for her at around 32 weeks.  She should have a cell free DNA test drawn in a few weeks to screen for fetal aneuploidy.  As the patient reports that she may not be able to function normally without taking these medications, she was advised to continue taking Lamictal and Seroquel throughout her pregnancy, as the benefits of these medications most likely outweigh the risk.  We will help with the management of her pregnancy should any fetal anomalies be detected.  The patient and her partner are happy and agreeable with this plan.

## 2020-05-06 ENCOUNTER — Telehealth: Payer: Self-pay | Admitting: Obstetrics and Gynecology

## 2020-05-06 NOTE — Telephone Encounter (Signed)
Pt is requesting MAT 21 Labs please advise so I may schedule.

## 2020-05-11 NOTE — Telephone Encounter (Signed)
If this patient wants the MaternT testing, she can have an OB visit and the provider who sees her can order the testing.

## 2020-05-12 ENCOUNTER — Other Ambulatory Visit: Payer: Self-pay

## 2020-05-12 ENCOUNTER — Other Ambulatory Visit: Payer: Self-pay | Admitting: Obstetrics and Gynecology

## 2020-05-12 ENCOUNTER — Other Ambulatory Visit: Payer: Medicaid Other

## 2020-05-12 DIAGNOSIS — Z1379 Encounter for other screening for genetic and chromosomal anomalies: Secondary | ICD-10-CM

## 2020-05-16 LAB — MATERNIT21 PLUS CORE+SCA
Fetal Fraction: 5
Monosomy X (Turner Syndrome): NOT DETECTED
Result (T21): NEGATIVE
Trisomy 13 (Patau syndrome): NEGATIVE
Trisomy 18 (Edwards syndrome): NEGATIVE
Trisomy 21 (Down syndrome): NEGATIVE
XXX (Triple X Syndrome): NOT DETECTED
XXY (Klinefelter Syndrome): NOT DETECTED
XYY (Jacobs Syndrome): NOT DETECTED

## 2020-05-16 LAB — SPECIMEN STATUS REPORT

## 2020-05-16 NOTE — L&D Delivery Note (Signed)
Delivery Note At 9:18 PM a viable female infant "Mary Crane" was delivered via Vaginal, Spontaneous (Presentation: Left Occiput Anterior).  APGAR: 8, 9; weight pending.   Placenta status: Spontaneous, Intact.  Cord: 3 vessels with the following complications: None.  Cord pH: n/a  Anesthesia: Epidural Episiotomy: None Lacerations: None Suture Repair: n/a Est. Blood Loss (mL): 250  Mom to postpartum.  Baby to Couplet care / Skin to Skin.  Called to see patient.  Mom pushed to deliver a viable female infant.  The head followed by shoulders, which delivered without difficulty, and the rest of the body.  No nuchal cord noted.  Baby to mom's chest.  Cord clamped and cut after > 1 min delay.  Cord blood obtained.  Placenta delivered spontaneously, intact, with a 3-vessel cord.  No vaginal, cervical, or perineal lacerations. All counts correct.  Hemostasis obtained with IV pitocin and fundal massage. EBL 250 mL.     Thomasene Mohair, MD 11/24/2020, 9:37 PM

## 2020-05-19 ENCOUNTER — Ambulatory Visit: Payer: Medicaid Other

## 2020-05-19 ENCOUNTER — Encounter: Payer: Medicaid Other | Admitting: Obstetrics and Gynecology

## 2020-05-27 ENCOUNTER — Ambulatory Visit: Payer: Medicaid Other

## 2020-05-27 ENCOUNTER — Other Ambulatory Visit: Payer: Self-pay

## 2020-05-27 ENCOUNTER — Ambulatory Visit (INDEPENDENT_AMBULATORY_CARE_PROVIDER_SITE_OTHER): Payer: Medicaid Other | Admitting: Obstetrics and Gynecology

## 2020-05-27 VITALS — BP 136/74 | Wt 149.0 lb

## 2020-05-27 DIAGNOSIS — O26892 Other specified pregnancy related conditions, second trimester: Secondary | ICD-10-CM

## 2020-05-27 DIAGNOSIS — Z3A14 14 weeks gestation of pregnancy: Secondary | ICD-10-CM

## 2020-05-27 DIAGNOSIS — R519 Headache, unspecified: Secondary | ICD-10-CM

## 2020-05-27 DIAGNOSIS — O26899 Other specified pregnancy related conditions, unspecified trimester: Secondary | ICD-10-CM | POA: Insufficient documentation

## 2020-05-27 DIAGNOSIS — F1290 Cannabis use, unspecified, uncomplicated: Secondary | ICD-10-CM

## 2020-05-27 DIAGNOSIS — O0990 Supervision of high risk pregnancy, unspecified, unspecified trimester: Secondary | ICD-10-CM

## 2020-05-27 LAB — POCT URINALYSIS DIPSTICK OB
Glucose, UA: NEGATIVE
POC,PROTEIN,UA: NEGATIVE

## 2020-05-27 MED ORDER — BUTALBITAL-APAP-CAFFEINE 50-325-40 MG PO CAPS: 1.0000 | ORAL_CAPSULE | Freq: Four times a day (QID) | ORAL | 0 refills | Status: DC | PRN

## 2020-05-27 NOTE — Progress Notes (Signed)
Routine Prenatal Care Visit  Subjective  Mary Crane is a 20 y.o. G1P0000 at [redacted]w[redacted]d being seen today for ongoing prenatal care.  She is currently monitored for the following issues for this high-risk pregnancy and has Abnormal celiac antibody panel; Gluten intolerance; Anaphylactic reaction; ADHD (attention deficit hyperactivity disorder); Supervision of high risk pregnancy, antepartum; Marijuana user; and Headache in pregnancy, antepartum, second trimester on their problem list.  ----------------------------------------------------------------------------------- Patient reports daily headaches, similar to HAs experienced outside of pregnancy. Patient reports typically hyrdation with water would decrease HA prior to pregnancy but now has difficulty treating them. Reports taking extra-strength tylenol, naps, and hyrdating, sometimes this interventions are helpful and sometimes they do not treat HA effectively..    .  .   . Denies leaking of fluid.  ----------------------------------------------------------------------------------- The following portions of the patient's history were reviewed and updated as appropriate: allergies, current medications, past family history, past medical history, past social history, past surgical history and problem list. Problem list updated.   Objective  Blood pressure 136/74, weight 149 lb (67.6 kg), last menstrual period 01/09/2020. Pregravid weight 144 lb (65.3 kg) Total Weight Gain 5 lb (2.268 kg) Urinalysis:      Fetal Status:           General:  Alert, oriented and cooperative. Patient is in no acute distress.  Skin: Skin is warm and dry. No rash noted.   Cardiovascular: Normal heart rate noted  Respiratory: Normal respiratory effort, no problems with respiration noted  Abdomen: Soft, gravid, appropriate for gestational age.       Pelvic:  Cervical exam deferred        Extremities: Normal range of motion.     ental Status: Normal mood  and affect. Normal behavior. Normal judgment and thought content.     Assessment   20 y.o. G1P0000 at [redacted]w[redacted]d by  11/23/2020, by Ultrasound presenting for routine prenatal visit  Plan   FIRST Problems (from 04/06/20 to present)    Problem Noted Resolved   Marijuana user 04/14/2020 by Mirna Mires, CNM No   Supervision of high risk pregnancy, antepartum 04/06/2020 by Mirna Mires, CNM No   Overview Addendum 05/27/2020  9:56 PM by Zipporah Plants, CNM     Clinic  [facility]  Prenatal Labs  Dating  9w [place] Blood type:   O negative  Genetic Screen   NIPS: neg x3, XX Antibody: negative  Anatomic [place]  scheduled with MFM Rubella:  immune  GTT   Third trimester:  RPR:   NR  Flu vaccine  HBsAg:   negative  TDaP vaccine                                               Rhogam: at 28 wks HIV:   NR  Baby Food                                               GBS: (For PCN allergy, check sensitivities)  Contraception  Pap:04/06/20  NILM  Circumcision    Pediatrician    Support Person  Laural Roes Chabis.             Previous Version      -HA in  pregnancy, fioricet rx'd for HA resistant to interventions described by patient. Emphasized trying other interventions first including hydration and rest.   Second trimester precautions including but not limited to vaginal bleeding, cramping, leaking of fluid and expectations for fetal movement were reviewed in detail with the patient.    Return in about 4 weeks (around 06/24/2020) for ROB.  Zipporah Plants, CNM, MSN [facility], [facility] 05/27/2020, 9:57 PM

## 2020-06-24 ENCOUNTER — Ambulatory Visit (INDEPENDENT_AMBULATORY_CARE_PROVIDER_SITE_OTHER): Payer: Medicaid Other | Admitting: Obstetrics

## 2020-06-24 ENCOUNTER — Other Ambulatory Visit: Payer: Self-pay

## 2020-06-24 VITALS — BP 118/80 | Wt 153.0 lb

## 2020-06-24 DIAGNOSIS — O0990 Supervision of high risk pregnancy, unspecified, unspecified trimester: Secondary | ICD-10-CM

## 2020-06-24 DIAGNOSIS — O26892 Other specified pregnancy related conditions, second trimester: Secondary | ICD-10-CM

## 2020-06-24 DIAGNOSIS — Z3A18 18 weeks gestation of pregnancy: Secondary | ICD-10-CM

## 2020-06-24 DIAGNOSIS — R519 Headache, unspecified: Secondary | ICD-10-CM

## 2020-06-24 LAB — POCT URINALYSIS DIPSTICK OB
Glucose, UA: NEGATIVE
POC,PROTEIN,UA: NEGATIVE

## 2020-06-24 MED ORDER — BUTALBITAL-APAP-CAFFEINE 50-325-40 MG PO CAPS: 1.0000 | ORAL_CAPSULE | Freq: Four times a day (QID) | ORAL | 0 refills | Status: DC | PRN

## 2020-06-24 NOTE — Addendum Note (Signed)
Addended by: Cornelius Moras D on: 06/24/2020 08:46 AM   Modules accepted: Orders

## 2020-06-24 NOTE — Progress Notes (Signed)
Routine Prenatal Care Visit  Subjective  Mary Crane is a 20 y.o. G1P0000 at [redacted]w[redacted]d being seen today for ongoing prenatal care.  She is currently monitored for the following issues for this high-risk pregnancy and has Abnormal celiac antibody panel; Gluten intolerance; Anaphylactic reaction; ADHD (attention deficit hyperactivity disorder); Supervision of high risk pregnancy, antepartum; Marijuana user; and Headache in pregnancy, antepartum on their problem list.  ----------------------------------------------------------------------------------- Patient reports headache.  These headaches are ongoing. She can feel her head "pulsing" almost all the time. There are periods when the headache is milder. Using Fioricet, but does not feel that it has helped very much. Continues to cut back on cigarettes. She denies smoking MJ now. Seeing the MFM for ultrasounds and ongoing assistance with her care.  . Vag. Bleeding: None.   . Leaking Fluid denies.  ----------------------------------------------------------------------------------- The following portions of the patient's history were reviewed and updated as appropriate: allergies, current medications, past family history, past medical history, past social history, past surgical history and problem list. Problem list updated.  Objective  Blood pressure 118/80, weight 153 lb (69.4 kg), last menstrual period 01/09/2020. Pregravid weight 144 lb (65.3 kg) Total Weight Gain 9 lb (4.082 kg) Urinalysis: Urine Protein    Urine Glucose    Fetal Status:           General:  Alert, oriented and cooperative. Patient is in no acute distress.  Skin: Skin is warm and dry. No rash noted.   Cardiovascular: Normal heart rate noted  Respiratory: Normal respiratory effort, no problems with respiration noted  Abdomen: Soft, gravid, appropriate for gestational age. Pain/Pressure: Absent     Pelvic:  Cervical exam deferred        Extremities: Normal range of  motion.  Edema: None  Mental Status: Normal mood and affect. Normal behavior. Normal judgment and thought content.   Assessment   20 y.o. G1P0000 at [redacted]w[redacted]d by  11/23/2020, by Ultrasound presenting for routine prenatal visit  Plan   FIRST Problems (from 04/06/20 to present)    Problem Noted Resolved   Marijuana user 04/14/2020 by Mirna Mires, CNM No   Supervision of high risk pregnancy, antepartum 04/06/2020 by Mirna Mires, CNM No   Overview Addendum 05/27/2020  9:56 PM by Zipporah Plants, CNM     Clinic  [facility]  Prenatal Labs  Dating  9w [place] Blood type:   O negative  Genetic Screen   NIPS: neg x3, XX Antibody: negative  Anatomic [place]  scheduled with MFM Rubella:  immune  GTT   Third trimester:  RPR:   NR  Flu vaccine  HBsAg:   negative  TDaP vaccine                                               Rhogam: at 28 wks HIV:   NR  Baby Food                                               GBS: (For PCN allergy, check sensitivities)  Contraception  Pap:04/06/20  NILM  Circumcision    Pediatrician    Support Person  Laural Roes Chabis.             Previous Version  Preterm labor symptoms and general obstetric precautions including but not limited to vaginal bleeding, contractions, leaking of fluid and fetal movement were reviewed in detail with the patient. Please refer to After Visit Summary for other counseling recommendations.  Discussed the "multipharma" practices and the unknown effects of her mood meds plus smoking and the the Bupap. Will refer her to a neuro doc to evaluate her headaches. She has sono appt with MFM next week.  No follow-ups on file.  Mirna Mires, CNM  06/24/2020 8:36 AM

## 2020-06-25 ENCOUNTER — Encounter: Payer: Self-pay | Admitting: Neurology

## 2020-06-30 ENCOUNTER — Other Ambulatory Visit: Payer: Self-pay | Admitting: Obstetrics

## 2020-06-30 DIAGNOSIS — O99332 Smoking (tobacco) complicating pregnancy, second trimester: Secondary | ICD-10-CM

## 2020-07-02 ENCOUNTER — Ambulatory Visit: Payer: Medicaid Other

## 2020-07-07 ENCOUNTER — Ambulatory Visit: Payer: Medicaid Other | Attending: Obstetrics and Gynecology

## 2020-07-07 ENCOUNTER — Other Ambulatory Visit: Payer: Self-pay

## 2020-07-07 DIAGNOSIS — F17210 Nicotine dependence, cigarettes, uncomplicated: Secondary | ICD-10-CM | POA: Insufficient documentation

## 2020-07-07 DIAGNOSIS — F410 Panic disorder [episodic paroxysmal anxiety] without agoraphobia: Secondary | ICD-10-CM | POA: Insufficient documentation

## 2020-07-07 DIAGNOSIS — F319 Bipolar disorder, unspecified: Secondary | ICD-10-CM | POA: Diagnosis not present

## 2020-07-07 DIAGNOSIS — O99342 Other mental disorders complicating pregnancy, second trimester: Secondary | ICD-10-CM | POA: Diagnosis not present

## 2020-07-07 DIAGNOSIS — O99332 Smoking (tobacco) complicating pregnancy, second trimester: Secondary | ICD-10-CM | POA: Diagnosis not present

## 2020-07-07 DIAGNOSIS — O09892 Supervision of other high risk pregnancies, second trimester: Secondary | ICD-10-CM | POA: Insufficient documentation

## 2020-07-07 DIAGNOSIS — Z3A20 20 weeks gestation of pregnancy: Secondary | ICD-10-CM

## 2020-07-22 ENCOUNTER — Encounter: Payer: Self-pay | Admitting: Obstetrics & Gynecology

## 2020-07-22 ENCOUNTER — Ambulatory Visit (INDEPENDENT_AMBULATORY_CARE_PROVIDER_SITE_OTHER): Payer: Medicaid Other | Admitting: Obstetrics & Gynecology

## 2020-07-22 ENCOUNTER — Other Ambulatory Visit: Payer: Self-pay

## 2020-07-22 VITALS — BP 120/80 | Wt 155.0 lb

## 2020-07-22 DIAGNOSIS — Z3A22 22 weeks gestation of pregnancy: Secondary | ICD-10-CM

## 2020-07-22 DIAGNOSIS — O0992 Supervision of high risk pregnancy, unspecified, second trimester: Secondary | ICD-10-CM

## 2020-07-22 DIAGNOSIS — Z131 Encounter for screening for diabetes mellitus: Secondary | ICD-10-CM

## 2020-07-22 LAB — POCT URINALYSIS DIPSTICK OB: Glucose, UA: NEGATIVE

## 2020-07-22 NOTE — Progress Notes (Signed)
  Subjective  Fetal Movement? yes Contractions? no Leaking Fluid? no Vaginal Bleeding? no Headaches slowly improving Objective  BP 120/80   Wt 155 lb (70.3 kg)   LMP 01/09/2020 (Exact Date)   BMI 28.35 kg/m  General: NAD Pumonary: no increased work of breathing Abdomen: gravid, non-tender Extremities: no edema Psychiatric: mood appropriate, affect full  Assessment  20 y.o. G1P0000 at [redacted]w[redacted]d by  11/23/2020, by Ultrasound presenting for routine prenatal visit  Plan   Problem List Items Addressed This Visit      Other   Supervision of high risk pregnancy, antepartum    Other Visit Diagnoses    [redacted] weeks gestation of pregnancy    -  Primary   Relevant Orders   POC Urinalysis Dipstick OB (Completed)   Screening for diabetes mellitus       Relevant Orders   28 Weeks RH-Panel      FIRST Problems (from 04/06/20 to present)    Problem Noted Resolved   Marijuana user     Supervision of high risk pregnancy, antepartum     Overview Addendum 07/22/2020  9:57 AM by Nadara Mustard, MD     Clinic  [facility]  Prenatal Labs  Dating  9w [place] Blood type: O/Negative/-- (12/06 1150) O negative  Genetic Screen   NIPS: neg x3, XX Antibody:Negative (12/06 1150)negative  Anatomic [place]  scheduled with MFM Rubella: 9.39 (12/06 1150)immune  GTT   Third trimester:  RPR: Non Reactive (12/06 1150) NR  Flu vaccine 10/21 HBsAg: Negative (12/06 1150) negative  TDaP vaccine                   Rhogam: at 28 wks HIV: Non Reactive (12/06 1150) NR  Baby Food          Breast if possible                                     GBS: (For PCN allergy, check sensitivities)  Contraception          DEPO Pap:04/06/20  NILM  Circumcision    Pediatrician    Support Person  Jone Baseman.        PNV  Glucola nv  Headaches discussed.  Sees neuro next month  Cont current meds, discussed Lamictal and Seroquel as necessary and with potential for maternal and neonatal effects.  Breastfeeding discussed.  Pros and  cons of breast feeding discussed, desires to try if safe based on meds she is on  Prefers Depo for birth control  Annamarie Major, MD, [facility], [facility] 07/22/2020  9:58 AM

## 2020-07-22 NOTE — Patient Instructions (Signed)
Thank you for choosing [facility]. As part of our ongoing efforts to improve patient experience, we would appreciate your feedback. Please fill out the short survey that you will receive by mail or MyChart. Your opinion is important to us! -Dr Porcia Morganti  Second Trimester of Pregnancy  The second trimester of pregnancy is from week 13 through week 27. This is months 4 through 6 of pregnancy. The second trimester is often a time when you feel your best. Your body has adjusted to being pregnant, and you begin to feel better physically. During the second trimester:  Morning sickness has lessened or stopped completely.  You may have more energy.  You may have an increase in appetite. The second trimester is also a time when the unborn baby (fetus) is growing rapidly. At the end of the sixth month, the fetus may be up to 12 inches long and weigh about 1 pounds. You will likely begin to feel the baby move (quickening) between 16 and 20 weeks of pregnancy. Body changes during your second trimester Your body continues to go through many changes during your second trimester. The changes vary and generally return to normal after the baby is born. Physical changes  Your weight will continue to increase. You will notice your lower abdomen bulging out.  You may begin to get stretch marks on your hips, abdomen, and breasts.  Your breasts will continue to grow and to become tender.  Dark spots or blotches (chloasma or mask of pregnancy) may develop on your face.  A dark line from your belly button to the pubic area (linea nigra) may appear.  You may have changes in your hair. These can include thickening of your hair, rapid growth, and changes in texture. Some people also have hair loss during or after pregnancy, or hair that feels dry or thin. Health changes  You may develop headaches.  You may have heartburn.  You may develop constipation.  You may develop hemorrhoids or swollen, bulging  veins (varicose veins).  Your gums may bleed and may be sensitive to brushing and flossing.  You may urinate more often because the fetus is pressing on your bladder.  You may have back pain. This is caused by: ? Weight gain. ? Pregnancy hormones that are relaxing the joints in your pelvis. ? A shift in weight and the muscles that support your balance. Follow these instructions at home: Medicines  Follow your health care provider's instructions regarding medicine use. Specific medicines may be either safe or unsafe to take during pregnancy. Do not take any medicines unless approved by your health care provider.  Take a prenatal vitamin that contains at least 600 micrograms (mcg) of folic acid. Eating and drinking  Eat a healthy diet that includes fresh fruits and vegetables, whole grains, good sources of protein such as meat, eggs, or tofu, and low-fat dairy products.  Avoid raw meat and unpasteurized juice, milk, and cheese. These carry germs that can harm you and your baby.  You may need to take these actions to prevent or treat constipation: ? Drink enough fluid to keep your urine pale yellow. ? Eat foods that are high in fiber, such as beans, whole grains, and fresh fruits and vegetables. ? Limit foods that are high in fat and processed sugars, such as fried or sweet foods. Activity  Exercise only as directed by your health care provider. Most people can continue their usual exercise routine during pregnancy. Try to exercise for 30 minutes at least   5 days a week. Stop exercising if you develop contractions in your uterus.  Stop exercising if you develop pain or cramping in the lower abdomen or lower back.  Avoid exercising if it is very hot or humid or if you are at a high altitude.  Avoid heavy lifting.  If you choose to, you may have sex unless your health care provider tells you not to. Relieving pain and discomfort  Wear a supportive bra to prevent discomfort from  breast tenderness.  Take warm sitz baths to soothe any pain or discomfort caused by hemorrhoids. Use hemorrhoid cream if your health care provider approves.  Rest with your legs raised (elevated) if you have leg cramps or low back pain.  If you develop varicose veins: ? Wear support hose as told by your health care provider. ? Elevate your feet for 15 minutes, 3-4 times a day. ? Limit salt in your diet. Safety  Wear your seat belt at all times when driving or riding in a car.  Talk with your health care provider if someone is verbally or physically abusive to you. Lifestyle  Do not use hot tubs, steam rooms, or saunas.  Do not douche. Do not use tampons or scented sanitary pads.  Avoid cat litter boxes and soil used by cats. These carry germs that can cause birth defects in the baby and possibly loss of the fetus by miscarriage or stillbirth.  Do not use herbal remedies, alcohol, illegal drugs, or medicines that are not approved by your health care provider. Chemicals in these products can harm your baby.  Do not use any products that contain nicotine or tobacco, such as cigarettes, e-cigarettes, and chewing tobacco. If you need help quitting, ask your health care provider. General instructions  During a routine prenatal visit, your health care provider will do a physical exam and other tests. He or she will also discuss your overall health. Keep all follow-up visits. This is important.  Ask your health care provider for a referral to a local prenatal education class.  Ask for help if you have counseling or nutritional needs during pregnancy. Your health care provider can offer advice or refer you to specialists for help with various needs. Where to find more information  American Pregnancy Association: americanpregnancy.org  American College of Obstetricians and Gynecologists: acog.org/en/Womens%20Health/Pregnancy  Office on Women's Health: womenshealth.gov/pregnancy Contact  a health care provider if you have:  A headache that does not go away when you take medicine.  Vision changes or you see spots in front of your eyes.  Mild pelvic cramps, pelvic pressure, or nagging pain in the abdominal area.  Persistent nausea, vomiting, or diarrhea.  A bad-smelling vaginal discharge or foul-smelling urine.  Pain when you urinate.  Sudden or extreme swelling of your face, hands, ankles, feet, or legs.  A fever. Get help right away if you:  Have fluid leaking from your vagina.  Have spotting or bleeding from your vagina.  Have severe abdominal cramping or pain.  Have difficulty breathing.  Have chest pain.  Have fainting spells.  Have not felt your baby move for the time period told by your health care provider.  Have new or increased pain, swelling, or redness in an arm or leg. Summary  The second trimester of pregnancy is from week 13 through week 27 (months 4 through 6).  Do not use herbal remedies, alcohol, illegal drugs, or medicines that are not approved by your health care provider. Chemicals in these products can   harm your baby.  Exercise only as directed by your health care provider. Most people can continue their usual exercise routine during pregnancy.  Keep all follow-up visits. This is important. This information is not intended to replace advice given to you by your health care provider. Make sure you discuss any questions you have with your health care provider. Document Revised: 10/09/2019 Document Reviewed: 08/15/2019 Elsevier Patient Education  2021 Elsevier Inc.  

## 2020-07-29 ENCOUNTER — Other Ambulatory Visit: Payer: Self-pay | Admitting: Obstetrics

## 2020-07-29 DIAGNOSIS — O09891 Supervision of other high risk pregnancies, first trimester: Secondary | ICD-10-CM

## 2020-07-29 DIAGNOSIS — O99332 Smoking (tobacco) complicating pregnancy, second trimester: Secondary | ICD-10-CM

## 2020-08-04 ENCOUNTER — Other Ambulatory Visit: Payer: Self-pay

## 2020-08-04 ENCOUNTER — Ambulatory Visit: Payer: Medicaid Other | Attending: Maternal & Fetal Medicine

## 2020-08-04 DIAGNOSIS — O99332 Smoking (tobacco) complicating pregnancy, second trimester: Secondary | ICD-10-CM | POA: Diagnosis present

## 2020-08-04 DIAGNOSIS — Z79899 Other long term (current) drug therapy: Secondary | ICD-10-CM | POA: Diagnosis not present

## 2020-08-04 DIAGNOSIS — Z3A24 24 weeks gestation of pregnancy: Secondary | ICD-10-CM | POA: Diagnosis not present

## 2020-08-04 DIAGNOSIS — O09891 Supervision of other high risk pregnancies, first trimester: Secondary | ICD-10-CM | POA: Diagnosis not present

## 2020-08-04 DIAGNOSIS — F17200 Nicotine dependence, unspecified, uncomplicated: Secondary | ICD-10-CM | POA: Diagnosis not present

## 2020-08-04 DIAGNOSIS — O09892 Supervision of other high risk pregnancies, second trimester: Secondary | ICD-10-CM | POA: Diagnosis not present

## 2020-08-19 ENCOUNTER — Encounter: Payer: Medicaid Other | Admitting: Obstetrics and Gynecology

## 2020-08-19 ENCOUNTER — Other Ambulatory Visit: Payer: Medicaid Other

## 2020-08-24 ENCOUNTER — Other Ambulatory Visit: Payer: Self-pay | Admitting: Obstetrics and Gynecology

## 2020-08-25 ENCOUNTER — Ambulatory Visit (INDEPENDENT_AMBULATORY_CARE_PROVIDER_SITE_OTHER): Payer: Medicaid Other | Admitting: Obstetrics

## 2020-08-25 ENCOUNTER — Other Ambulatory Visit: Payer: Self-pay

## 2020-08-25 ENCOUNTER — Other Ambulatory Visit: Payer: Medicaid Other

## 2020-08-25 VITALS — BP 106/60 | Wt 157.0 lb

## 2020-08-25 DIAGNOSIS — Z3A27 27 weeks gestation of pregnancy: Secondary | ICD-10-CM

## 2020-08-25 DIAGNOSIS — O0992 Supervision of high risk pregnancy, unspecified, second trimester: Secondary | ICD-10-CM

## 2020-08-25 LAB — POCT URINALYSIS DIPSTICK OB
Glucose, UA: NEGATIVE
POC,PROTEIN,UA: NEGATIVE

## 2020-08-25 NOTE — Progress Notes (Signed)
ROB- having hip cramps since yesterday

## 2020-08-25 NOTE — Progress Notes (Signed)
Routine Prenatal Care Visit  Subjective  Mary Crane is a 20 y.o. G1P0000 at [redacted]w[redacted]d being seen today for ongoing prenatal care.  She is currently monitored for the following issues for this high-risk pregnancy and has Abnormal celiac antibody panel; Gluten intolerance; Anaphylactic reaction; ADHD (attention deficit hyperactivity disorder); Supervision of high risk pregnancy, antepartum; Marijuana user; Headache in pregnancy, antepartum; Generalized anxiety disorder with panic attacks; and Bipolar depression (HCC) on their problem list.  ----------------------------------------------------------------------------------- Patient reports headache, no bleeding, no contractions, no cramping, no leaking and and she has had some hip pain. Is not able to have her 28 wk labs for a few weeks .    .  .   . Leaking Fluid denies.  ----------------------------------------------------------------------------------- The following portions of the patient's history were reviewed and updated as appropriate: allergies, current medications, past family history, past medical history, past social history, past surgical history and problem list. Problem list updated.  Objective  Blood pressure 106/60, weight 157 lb (71.2 kg), last menstrual period 01/09/2020. Pregravid weight 144 lb (65.3 kg) Total Weight Gain 13 lb (5.897 kg) Urinalysis: Urine Protein Negative  Urine Glucose Negative  Fetal Status:           General:  Alert, oriented and cooperative. Patient is in no acute distress.  Skin: Skin is warm and dry. No rash noted.   Cardiovascular: Normal heart rate noted  Respiratory: Normal respiratory effort, no problems with respiration noted  Abdomen: Soft, gravid, appropriate for gestational age.       Pelvic:  Cervical exam deferred        Extremities: Normal range of motion.     Mental Status: Normal mood and affect. Normal behavior. Normal judgment and thought content.   Assessment   20 y.o.  G1P0000 at [redacted]w[redacted]d by  11/23/2020, by Ultrasound presenting for routine prenatal visit  Plan   FIRST Problems (from 04/06/20 to present)    Problem Noted Resolved   Marijuana user 04/14/2020 by Mirna Mires, CNM No   Supervision of high risk pregnancy, antepartum 04/06/2020 by Mirna Mires, CNM No   Overview Addendum 07/22/2020  9:57 AM by Nadara Mustard, MD     Clinic  [facility]  Prenatal Labs  Dating  9w [place] Blood type: O/Negative/-- (12/06 1150) O negative  Genetic Screen   NIPS: neg x3, XX Antibody:Negative (12/06 1150)negative  Anatomic [place]  scheduled with MFM Rubella: 9.39 (12/06 1150)immune  GTT   Third trimester:  RPR: Non Reactive (12/06 1150) NR  Flu vaccine 10/21 HBsAg: Negative (12/06 1150) negative  TDaP vaccine                   Rhogam: at 28 wks HIV: Non Reactive (12/06 1150) NR  Baby Food          Breast if possible                                     GBS: (For PCN allergy, check sensitivities)  Contraception          DEPO Pap:04/06/20  NILM  Circumcision    Pediatrician    Support Person  Jone Baseman.             Previous Version       Preterm labor symptoms and general obstetric precautions including but not limited to vaginal bleeding, contractions, leaking of fluid and fetal movement  were reviewed in detail with the patient. Please refer to After Visit Summary for other counseling recommendations.  Harris ordered her 28 week labs which can be done in two weeks.  Return in about 2 weeks (around 09/08/2020) for return OB, 28 week labs.  Mirna Mires, CNM  08/25/2020 3:28 PM

## 2020-08-31 NOTE — Progress Notes (Signed)
NEUROLOGY CONSULTATION NOTE  Mary Crane MRN: [id number] DOB: July 12, 2000  Referring provider: Thamas Jaegers, MD Primary care provider: Thamas Jaegers, MD  Reason for consult:  headache  Assessment/Plan:   Worsening headache in setting of pregnancy - likely hormonal.  Suspicion is low but given that she also is a cigarette smoker, will evaluate for venous sinus thrombosis  1.  MRV of brain without contrast 2.  Recommended starting magnesium oxide 466m and riboflavin 4028mdaily.   3.  Limit use of Tylenol to no more than 2 days out of week to prevent rebound headache 4.  Other relieving therapies are using cold or warm compresses or Biofreeze applied to head where it is painful. 5.  Follow up in 6 weeks.   Subjective:  Mary Crane a [age]ear old right-handed female at 2831eeks gestation who presents for headaches.  History supplemetned by OBGYN note.  History of headaches described as mild-moderate diffuse pounding headache with photophobia.  No nausea or visual symptoms.  They abort quickly with ibuprofen and would occur 1 to 2 a month.  Since pregnancy her headaches are more severe, diffuse pounding, eyes hurt, with photophobia but still no nausea, vomiting, visual disturbance, numbness or weakness.  They started mid-January, a couple of times a week, then February 4-5 times a week. In March she had 4-5 a month.  In April they have been daily.  They are a diffuse pounding headache.  Triggers/aggravating factors include coughing or taking a nap.  She takes Tylenol 3 to 5 days a week.  Fioricet ineffective.  She smokes 1/2 ppd or less.   Current analgesic:  Tylenol Current antiepileptic:  Lamotrigine Other medications:  Seroquel, Prenatal  Past analgesic:  Fioricet   PAST MEDICAL HISTORY: Past Medical History:  Diagnosis Date  . Abdominal pain   . Abdominal pain   . Abnormal celiac antibody panel   . Allergy   . Anxiety   . Bipolar  depression ([place]  . Borderline personality disorder ([place]  . Depression   . Early satiety   . Eczema    bilateral legs  . Headache(784.0)   . Nausea   . Nausea   . Panic disorder   . Seasonal allergies   . Vision abnormalities    Hx: of wears glasses    PAST SURGICAL HISTORY: Past Surgical History:  Procedure Laterality Date  . ADENOIDECTOMY    . ESOPHAGOGASTRODUODENOSCOPY N/A 06/14/2013   Procedure: ESOPHAGOGASTRODUODENOSCOPY (EGD);  Surgeon: JoOletha BlendMD;  [place] [place];  Laterality: N/A;  . TONSILLECTOMY    . WISDOM TOOTH EXTRACTION      MEDICATIONS: Current Outpatient Medications on File Prior to Visit  Medication Sig Dispense Refill  . acetaminophen (TYLENOL) 500 MG tablet Take 500 mg by mouth as needed.    . lamoTRIgine 25 & 50 & 100 MG KIT Take 100 mg by mouth daily.    . Prenatal Vit-Fe Fumarate-FA (PRENATAL VITAMINS) 28-0.8 MG TABS Take by mouth.    . QUEtiapine (SEROQUEL) 300 MG tablet Take 1 tablet by mouth daily.     No current facility-administered medications on file prior to visit.    ALLERGIES: Allergies  Allergen Reactions  . Azithromycin Anaphylaxis, Hives, Other (See Comments) and Itching    Trouble breathing Trouble breathing  . Augmentin [Amoxicillin-Pot Clavulanate] Hives  . Gluten Meal Nausea And Vomiting    FAMILY HISTORY: Family History  Problem Relation Age of Onset  .  Cholelithiasis Maternal Grandmother   . Hypertension Maternal Grandmother   . Hyperlipidemia Maternal Grandmother   . Thyroid disease Maternal Grandmother   . Cholelithiasis Maternal Grandfather   . Hypertension Maternal Grandfather   . Heart Problems Maternal Grandfather   . Hyperlipidemia Mother   . Thyroid disease Mother   . Celiac disease Neg Hx     Objective:  Blood pressure 120/81, pulse (!) 120, resp. rate 20, height '5\' 2"'  (1.575 m), weight 161 lb (73 kg), last menstrual period 01/09/2020, SpO2 97 %. General: No acute  distress.  Patient appears well-groomed.   Head:  Normocephalic/atraumatic Eyes:  fundi examined but not visualized Neck: supple, no paraspinal tenderness, full range of motion Back: No paraspinal tenderness Heart: regular rate and rhythm Lungs: Clear to auscultation bilaterally. Vascular: No carotid bruits. Neurological Exam: Mental status: alert and oriented to person, place, and time, recent and remote memory intact, fund of knowledge intact, attention and concentration intact, speech fluent and not dysarthric, language intact. Cranial nerves: CN I: not tested CN II: pupils equal, round and reactive to light, visual fields intact CN III, IV, VI:  full range of motion, no nystagmus, no ptosis CN V: facial sensation intact. CN VII: upper and lower face symmetric CN VIII: hearing intact CN IX, X: gag intact, uvula midline CN XI: sternocleidomastoid and trapezius muscles intact CN XII: tongue midline Bulk & Tone: normal, no fasciculations. Motor:  muscle strength 5/5 throughout Sensation:  Pinprick, temperature and vibratory sensation intact. Deep Tendon Reflexes:  2+ throughout,  toes downgoing.   Finger to nose testing:  Without dysmetria.   Heel to shin:  Without dysmetria.   Gait:  Normal station and stride.  Romberg negative.    Thank you for allowing me to take part in the care of this patient.  Metta Clines, DO  CC: Duffy Bruce, MD

## 2020-09-01 ENCOUNTER — Other Ambulatory Visit: Payer: Self-pay | Admitting: Obstetrics

## 2020-09-01 ENCOUNTER — Encounter: Payer: Self-pay | Admitting: Neurology

## 2020-09-01 ENCOUNTER — Ambulatory Visit: Payer: Medicaid Other | Admitting: Neurology

## 2020-09-01 ENCOUNTER — Other Ambulatory Visit: Payer: Self-pay

## 2020-09-01 VITALS — BP 120/81 | HR 120 | Resp 20 | Ht 62.0 in | Wt 161.0 lb

## 2020-09-01 DIAGNOSIS — F17200 Nicotine dependence, unspecified, uncomplicated: Secondary | ICD-10-CM | POA: Diagnosis not present

## 2020-09-01 DIAGNOSIS — R519 Headache, unspecified: Secondary | ICD-10-CM

## 2020-09-01 DIAGNOSIS — Z3A28 28 weeks gestation of pregnancy: Secondary | ICD-10-CM | POA: Diagnosis not present

## 2020-09-01 DIAGNOSIS — Z3689 Encounter for other specified antenatal screening: Secondary | ICD-10-CM

## 2020-09-01 NOTE — Patient Instructions (Addendum)
1.  Start taking magnesium oxide 400mg  daily and riboflavin (vitamin B2) 400mg  daily. 2.  Limit use of Tylenol to no more than 2 days out of week to prevent rebound headache 3.  Check MRV of head without contrast 4.  Follow up 6 weeks  We have sent a referral to [facility] for your MRV and they will call you directly to schedule your appointment. They are located at [address] [facility]. If you need to contact them directly please call [phone].

## 2020-09-08 ENCOUNTER — Encounter: Payer: Medicaid Other | Admitting: Obstetrics

## 2020-09-08 ENCOUNTER — Encounter: Payer: Self-pay | Admitting: Advanced Practice Midwife

## 2020-09-08 ENCOUNTER — Ambulatory Visit (INDEPENDENT_AMBULATORY_CARE_PROVIDER_SITE_OTHER): Payer: Medicaid Other | Admitting: Advanced Practice Midwife

## 2020-09-08 ENCOUNTER — Other Ambulatory Visit: Payer: Self-pay

## 2020-09-08 VITALS — BP 120/68 | Wt 160.0 lb

## 2020-09-08 DIAGNOSIS — F411 Generalized anxiety disorder: Secondary | ICD-10-CM

## 2020-09-08 DIAGNOSIS — O0993 Supervision of high risk pregnancy, unspecified, third trimester: Secondary | ICD-10-CM

## 2020-09-08 DIAGNOSIS — F319 Bipolar disorder, unspecified: Secondary | ICD-10-CM

## 2020-09-08 DIAGNOSIS — Z3A29 29 weeks gestation of pregnancy: Secondary | ICD-10-CM

## 2020-09-08 DIAGNOSIS — O26893 Other specified pregnancy related conditions, third trimester: Secondary | ICD-10-CM

## 2020-09-08 DIAGNOSIS — Z6791 Unspecified blood type, Rh negative: Secondary | ICD-10-CM | POA: Diagnosis not present

## 2020-09-08 DIAGNOSIS — F410 Panic disorder [episodic paroxysmal anxiety] without agoraphobia: Secondary | ICD-10-CM

## 2020-09-08 LAB — POCT URINALYSIS DIPSTICK OB
Glucose, UA: NEGATIVE
POC,PROTEIN,UA: NEGATIVE

## 2020-09-08 MED ORDER — RHO D IMMUNE GLOBULIN 1500 UNIT/2ML IJ SOSY
300.0000 ug | PREFILLED_SYRINGE | Freq: Once | INTRAMUSCULAR | Status: AC
  Administered 2020-09-08: 300 ug via INTRAMUSCULAR

## 2020-09-08 NOTE — Patient Instructions (Signed)

## 2020-09-08 NOTE — Progress Notes (Signed)
Routine Prenatal Care Visit  Subjective  Mary Crane is a 20 y.o. G1P0000 at [redacted]w[redacted]d being seen today for ongoing prenatal care.  She is currently monitored for the following issues for this high-risk pregnancy and has Abnormal celiac antibody panel; Gluten intolerance; Anaphylactic reaction; ADHD (attention deficit hyperactivity disorder); Supervision of high risk pregnancy, antepartum; Marijuana user; Headache in pregnancy, antepartum; Generalized anxiety disorder with panic attacks; and Bipolar depression (HCC) on their problem list.  ----------------------------------------------------------------------------------- Patient reports no complaints.  We discussed her current medications and associated lactation categories. She may discuss with her psychiatrist. Contractions: Not present. Vag. Bleeding: None.  Movement: Present. Leaking Fluid denies.  ----------------------------------------------------------------------------------- The following portions of the patient's history were reviewed and updated as appropriate: allergies, current medications, past family history, past medical history, past social history, past surgical history and problem list. Problem list updated.  Objective  Blood pressure 120/68, weight 160 lb (72.6 kg), last menstrual period 01/09/2020. Pregravid weight 144 lb (65.3 kg) Total Weight Gain 16 lb (7.258 kg) Urinalysis: Urine Protein Negative  Urine Glucose Negative  Fetal Status: Fetal Heart Rate (bpm): 152 Fundal Height: 30 cm Movement: Present     General:  Alert, oriented and cooperative. Patient is in no acute distress.  Skin: Skin is warm and dry. No rash noted.   Cardiovascular: Normal heart rate noted  Respiratory: Normal respiratory effort, no problems with respiration noted  Abdomen: Soft, gravid, appropriate for gestational age. Pain/Pressure: Absent     Pelvic:  Cervical exam deferred        Extremities: Normal range of motion.  Edema:  None  Mental Status: Normal mood and affect. Normal behavior. Normal judgment and thought content.   Assessment   20 y.o. G1P0000 at [redacted]w[redacted]d by  11/23/2020, by Ultrasound presenting for routine prenatal visit  Plan   FIRST Problems (from 04/06/20 to present)    Problem Noted Resolved   Marijuana user 04/14/2020 by Mirna Mires, CNM No   Supervision of high risk pregnancy, antepartum 04/06/2020 by Mirna Mires, CNM No   Overview Addendum 07/22/2020  9:57 AM by Nadara Mustard, MD     Clinic  [facility]  Prenatal Labs  Dating  9w [place] Blood type: O/Negative/-- (12/06 1150) O negative  Genetic Screen   NIPS: neg x3, XX Antibody:Negative (12/06 1150)negative  Anatomic [place]  scheduled with MFM Rubella: 9.39 (12/06 1150)immune  GTT   Third trimester:  RPR: Non Reactive (12/06 1150) NR  Flu vaccine 10/21 HBsAg: Negative (12/06 1150) negative  TDaP vaccine                   Rhogam: given 09/08/20 HIV: Non Reactive (12/06 1150) NR  Baby Food          Breast if possible                                     GBS: (For PCN allergy, check sensitivities)  Contraception          DEPO Pap:04/06/20  NILM  Circumcision    Pediatrician    Support Person  Laural Roes Chabis.             Previous Version       Preterm labor symptoms and general obstetric precautions including but not limited to vaginal bleeding, contractions, leaking of fluid and fetal movement were reviewed in detail with the patient. Please refer  to After Visit Summary for other counseling recommendations.   Rhogam today Return in about 2 weeks (around 09/22/2020) for rob.  Tresea Mall, CNM 09/08/2020 3:03 PM

## 2020-09-16 ENCOUNTER — Other Ambulatory Visit: Payer: Medicaid Other

## 2020-09-16 ENCOUNTER — Other Ambulatory Visit: Payer: Self-pay

## 2020-09-16 DIAGNOSIS — Z131 Encounter for screening for diabetes mellitus: Secondary | ICD-10-CM

## 2020-09-18 LAB — 28 WEEKS RH-PANEL
Basophils Absolute: 0.1 10*3/uL (ref 0.0–0.2)
Basos: 0 %
EOS (ABSOLUTE): 0.3 10*3/uL (ref 0.0–0.4)
Eos: 2 %
Gestational Diabetes Screen: 118 mg/dL (ref 65–139)
HIV Screen 4th Generation wRfx: NONREACTIVE
Hematocrit: 35.2 % (ref 34.0–46.6)
Hemoglobin: 12 g/dL (ref 11.1–15.9)
Immature Grans (Abs): 0.3 10*3/uL — ABNORMAL HIGH (ref 0.0–0.1)
Immature Granulocytes: 2 %
Lymphocytes Absolute: 3.5 10*3/uL — ABNORMAL HIGH (ref 0.7–3.1)
Lymphs: 17 %
MCH: 30.9 pg (ref 26.6–33.0)
MCHC: 34.1 g/dL (ref 31.5–35.7)
MCV: 91 fL (ref 79–97)
Monocytes Absolute: 1.4 10*3/uL — ABNORMAL HIGH (ref 0.1–0.9)
Monocytes: 7 %
Neutrophils Absolute: 14.6 10*3/uL — ABNORMAL HIGH (ref 1.4–7.0)
Neutrophils: 72 %
Platelets: 368 10*3/uL (ref 150–450)
RBC: 3.88 x10E6/uL (ref 3.77–5.28)
RDW: 13.1 % (ref 11.7–15.4)
RPR Ser Ql: NONREACTIVE
WBC: 20.2 10*3/uL (ref 3.4–10.8)

## 2020-09-18 LAB — AB SCR+ANTIBODY ID: Antibody Screen: POSITIVE — AB

## 2020-09-21 ENCOUNTER — Other Ambulatory Visit: Payer: Self-pay | Admitting: Obstetrics & Gynecology

## 2020-09-21 ENCOUNTER — Other Ambulatory Visit: Payer: Medicaid Other

## 2020-09-21 DIAGNOSIS — D72829 Elevated white blood cell count, unspecified: Secondary | ICD-10-CM

## 2020-09-21 NOTE — Telephone Encounter (Signed)
Patient is scheduled for 09/28/20 at 10 am for labs

## 2020-09-22 ENCOUNTER — Ambulatory Visit (INDEPENDENT_AMBULATORY_CARE_PROVIDER_SITE_OTHER): Payer: Medicaid Other | Admitting: Obstetrics and Gynecology

## 2020-09-22 ENCOUNTER — Other Ambulatory Visit: Payer: Self-pay

## 2020-09-22 ENCOUNTER — Encounter: Payer: Self-pay | Admitting: Obstetrics and Gynecology

## 2020-09-22 VITALS — BP 116/70 | Ht 62.0 in | Wt 160.8 lb

## 2020-09-22 DIAGNOSIS — Z3A31 31 weeks gestation of pregnancy: Secondary | ICD-10-CM

## 2020-09-22 DIAGNOSIS — O0993 Supervision of high risk pregnancy, unspecified, third trimester: Secondary | ICD-10-CM

## 2020-09-22 DIAGNOSIS — Z23 Encounter for immunization: Secondary | ICD-10-CM | POA: Diagnosis not present

## 2020-09-22 DIAGNOSIS — O0990 Supervision of high risk pregnancy, unspecified, unspecified trimester: Secondary | ICD-10-CM

## 2020-09-22 NOTE — Addendum Note (Signed)
Addended by: Clement Husbands A on: 09/22/2020 03:42 PM   Modules accepted: Orders

## 2020-09-22 NOTE — Progress Notes (Signed)
Routine Prenatal Care Visit  Subjective  Mary Crane is a 20 y.o. G1P0000 at [redacted]w[redacted]d being seen today for ongoing prenatal care.  She is currently monitored for the following issues for this high-risk pregnancy and has Abnormal celiac antibody panel; Gluten intolerance; Anaphylactic reaction; ADHD (attention deficit hyperactivity disorder); Supervision of high risk pregnancy, antepartum; Marijuana user; Headache in pregnancy, antepartum; Generalized anxiety disorder with panic attacks; and Bipolar depression (HCC) on their problem list.  ----------------------------------------------------------------------------------- Patient reports no complaints.  She reports concern regarding current medication regimen and breastfeeding. Patient requests information regarding these medications and safety profile for breastfeeding. Contractions: Not present. Vag. Bleeding: None.  Movement: Present. Denies leaking of fluid.  ----------------------------------------------------------------------------------- The following portions of the patient's history were reviewed and updated as appropriate: allergies, current medications, past family history, past medical history, past social history, past surgical history and problem list. Problem list updated.   Objective  Blood pressure 116/70, height 5\' 2"  (1.575 m), weight 160 lb 12.8 oz (72.9 kg), last menstrual period 01/09/2020. Pregravid weight 144 lb (65.3 kg) Total Weight Gain 16 lb 12.8 oz (7.62 kg) Urinalysis:      Fetal Status: Fetal Heart Rate (bpm): 143 Fundal Height: 31 cm Movement: Present     General:  Alert, oriented and cooperative. Patient is in no acute distress.  Skin: Skin is warm and dry. No rash noted.   Cardiovascular: Normal heart rate noted  Respiratory: Normal respiratory effort, no problems with respiration noted  Abdomen: Soft, gravid, appropriate for gestational age. Pain/Pressure: Absent     Pelvic:  Cervical exam  deferred        Extremities: Normal range of motion.  Edema: None  ental Status: Normal mood and affect. Normal behavior. Normal judgment and thought content.     Assessment   20 y.o. G1P0000 at [redacted]w[redacted]d by  11/23/2020, by Ultrasound presenting for routine prenatal visit  Plan   FIRST Problems (from 04/06/20 to present)    Problem Noted Resolved   Marijuana user 04/14/2020 by 04/16/2020, CNM No   Supervision of high risk pregnancy, antepartum 04/06/2020 by 04/08/2020, CNM No   Overview Addendum 09/22/2020  3:24 PM by 11/22/2020, CNM     Nursing Staff Provider  Office Location  [facility] Dating   9w Zipporah Plants  Language  English Anatomy US   complete, normal  Flu Vaccine   10/21 Genetic Screen  NIPS: neg x3, XX  TDaP vaccine   09/22/20 Hgb A1C or  GTT Third trimester : 118  Rhogam   09/08/20   LAB RESULTS   Feeding Plan  breast  Blood Type O/Negative/-- (12/06 1150)   Contraception  depo Antibody Positive, See Final Results (05/04 1543)  Circumcision  Rubella 9.39 (12/06 1150)  Pediatrician   RPR Non Reactive (05/04 1543)   Support Person  Fiance 10-06-2003 HBsAg Negative (12/06 1150)   Prenatal Classes  Resources provided 09/22/20 HIV Non Reactive (05/04 1543)    Varicella  immune  BTL Consent  n/a GBS  (For PCN allergy, check sensitivities)        VBAC Consent  n/a Pap  04/06/20 NILM    Hgb Electro      CF      SMA                  Previous Version      -Reviewed lactation category information regarding seroquel and lamictal in pregnancy. Both currently reported as L2  per [facility]. -Plan for f/u lab draw on 5/16 for incidental finding of leukocytosis -[place] with MFM next week  Preterm precautions including but not limited to vaginal bleeding, contractions, leaking of fluid and fetal movement were reviewed in detail with the patient.    Return in about 2 weeks (around 10/06/2020) for ROB.  Zipporah Plants, CNM, MSN [facility], [facility] 09/22/2020, 3:26 PM

## 2020-09-28 ENCOUNTER — Other Ambulatory Visit: Payer: Medicaid Other

## 2020-09-28 ENCOUNTER — Other Ambulatory Visit: Payer: Self-pay

## 2020-09-29 ENCOUNTER — Other Ambulatory Visit: Payer: Self-pay

## 2020-09-29 ENCOUNTER — Ambulatory Visit: Payer: Medicaid Other | Attending: Maternal & Fetal Medicine

## 2020-09-29 ENCOUNTER — Telehealth: Payer: Self-pay | Admitting: Obstetrics

## 2020-09-29 DIAGNOSIS — Z3689 Encounter for other specified antenatal screening: Secondary | ICD-10-CM | POA: Insufficient documentation

## 2020-09-29 DIAGNOSIS — Z3A32 32 weeks gestation of pregnancy: Secondary | ICD-10-CM | POA: Diagnosis not present

## 2020-09-29 DIAGNOSIS — F17210 Nicotine dependence, cigarettes, uncomplicated: Secondary | ICD-10-CM | POA: Diagnosis not present

## 2020-09-29 DIAGNOSIS — O09893 Supervision of other high risk pregnancies, third trimester: Secondary | ICD-10-CM | POA: Insufficient documentation

## 2020-09-29 DIAGNOSIS — O99333 Smoking (tobacco) complicating pregnancy, third trimester: Secondary | ICD-10-CM | POA: Insufficient documentation

## 2020-09-29 DIAGNOSIS — O09891 Supervision of other high risk pregnancies, first trimester: Secondary | ICD-10-CM

## 2020-09-29 LAB — CBC WITH DIFFERENTIAL
Basophils Absolute: 0.1 10*3/uL (ref 0.0–0.2)
Basos: 0 %
EOS (ABSOLUTE): 0.3 10*3/uL (ref 0.0–0.4)
Eos: 2 %
Hematocrit: 32.6 % — ABNORMAL LOW (ref 34.0–46.6)
Hemoglobin: 11.2 g/dL (ref 11.1–15.9)
Immature Grans (Abs): 0.2 10*3/uL — ABNORMAL HIGH (ref 0.0–0.1)
Immature Granulocytes: 1 %
Lymphocytes Absolute: 3.3 10*3/uL — ABNORMAL HIGH (ref 0.7–3.1)
Lymphs: 20 %
MCH: 30.9 pg (ref 26.6–33.0)
MCHC: 34.4 g/dL (ref 31.5–35.7)
MCV: 90 fL (ref 79–97)
Monocytes Absolute: 1.3 10*3/uL — ABNORMAL HIGH (ref 0.1–0.9)
Monocytes: 8 %
Neutrophils Absolute: 11.1 10*3/uL — ABNORMAL HIGH (ref 1.4–7.0)
Neutrophils: 69 %
RBC: 3.62 x10E6/uL — ABNORMAL LOW (ref 3.77–5.28)
RDW: 12.9 % (ref 11.7–15.4)
WBC: 16.3 10*3/uL — ABNORMAL HIGH (ref 3.4–10.8)

## 2020-09-29 NOTE — Telephone Encounter (Signed)
Patient calling about her u/s that she had done today at [facility]. Would like to have a call back from [place].   PT CB# [phone]

## 2020-09-30 NOTE — Progress Notes (Signed)
Growth scan at 32 weeks shows growth is at 73% and AFI is 12.6 Patient needs Lamotrigine level checked q trimester.

## 2020-10-06 NOTE — Progress Notes (Signed)
NEUROLOGY FOLLOW UP OFFICE NOTE  Mary Crane [id number]  Assessment/Plan:   1.  Migraine without aura, without status migrainosus, not intractable - headaches still frequent despite magnesium and riboflavin.  They affect her quality of life enough that she would like to start a medication for the rest of her pregnancy in order to reduce headache frequency. 2.  Pregnant at [redacted] weeks  1.  I have asked that she check with her OBGYN to see if she can start cyproheptadine.  If okay, she will contact me 2.  Tylenol as needed but limit use to no more than 2 days out of week to prevent rebound headache. 3.  As headaches have overall lessened in intensity, and neurologic exam unremarkable, MRV is no longer indicated. 4.  Follow up  Subjective:  Mary Crane is a 20 year old right-handed female at [redacted] weeks gestation who follows up for headaches.  UPDATE: The MRV was cancelled because they reportedly did not get a prior authorization.    Headaches are unchanged, they have not improved with her pregnancy.  They are less severe but still averaging 4 to 5 days a week.  Responds to Tylenol but may return at night at bedtime.  Limits Tylenol to no more than 2 days out of the week. Current analgesic:  Tylenol Current antiepileptic:  Lamotrigine Current vitamins/supplements:  Magnesium oxide 439m daily, riboflavin 4046mdaily, prenatal Other medications:  Seroquel  HISTORY: History of headaches described as mild-moderate diffuse pounding headache with photophobia.  No nausea or visual symptoms.  They abort quickly with ibuprofen and would occur 1 to 2 a month.  Since pregnancy her headaches are more severe, diffuse pounding, eyes hurt, with photophobia but still no nausea, vomiting, visual disturbance, numbness or weakness.  They started mid-January, a couple of times a week, then February 4-5 times a week. In March she had 4-5 a month.  In April they have been daily.   They are a diffuse pounding headache.  Triggers/aggravating factors include coughing or taking a nap.  She takes Tylenol 3 to 5 days a week.  Fioricet ineffective.  She smokes 1/2 ppd or less.    Past analgesic:  Fioricet  PAST MEDICAL HISTORY: Past Medical History:  Diagnosis Date  . Abdominal pain   . Abdominal pain   . Abnormal celiac antibody panel   . Allergy   . Anxiety   . [place]ipolar depression ([place]  . Borderline personality disorder ([place]  . Depression   . Early satiety   . Eczema    bilateral legs  . Headache(784.0)   . Nausea   . Nausea   . Panic disorder   . Seasonal allergies   . Vision abnormalities    Hx: of wears glasses    MEDICATIONS: Current Outpatient Medications on File Prior to Visit  Medication Sig Dispense Refill  . acetaminophen (TYLENOL) 500 MG tablet Take 500 mg by mouth as needed.    . lamoTRIgine 25 & 50 & 100 MG KIT Take 100 mg by mouth daily.    . Prenatal Vit-Fe Fumarate-FA (PRENATAL VITAMINS) 28-0.8 MG TABS Take by mouth.    . QUEtiapine (SEROQUEL) 300 MG tablet Take 1 tablet by mouth daily.     No current facility-administered medications on file prior to visit.    ALLERGIES: Allergies  Allergen Reactions  . Azithromycin Anaphylaxis, Hives, Other (See Comments) and Itching    Trouble breathing Trouble breathing  . Augmentin [Amoxicillin-Pot Clavulanate]  Hives  . Gluten Meal Nausea And Vomiting    FAMILY HISTORY: Family History  Problem Relation Age of Onset  . Cholelithiasis Maternal Grandmother   . Hypertension Maternal Grandmother   . Hyperlipidemia Maternal Grandmother   . Thyroid disease Maternal Grandmother   . Cholelithiasis Maternal Grandfather   . Hypertension Maternal Grandfather   . Heart Problems Maternal Grandfather   . Hyperlipidemia Mother   . Thyroid disease Mother   . Celiac disease Neg Hx       Objective:  Blood pressure 122/88, pulse (!) 112, height _0  (1.575 m), weight 163 lb (73.9 kg), last  menstrual period 01/09/2020, SpO2 95 %. General: No acute distress.  Patient appears well-groomed.   Head:  Normocephalic/atraumatic Eyes:  Fundi examined but not visualized Neck: supple, no paraspinal tenderness, full range of motion Heart:  Regular rate and rhythm Lungs:  Clear to auscultation bilaterally Back: No paraspinal tenderness Neurological Exam: alert and oriented to person, place, and time.  Speech fluent and not dysarthric, language intact.  CN II-XII intact. Bulk and tone normal, muscle strength 5/5 throughout.  Sensation to light touch intact.  Deep tendon reflexes 2+ throughout.  Finger to nose testing intact.  Gait normal, Romberg negative.   Metta Clines, DO  CC: Thamas Jaegers, MD

## 2020-10-07 ENCOUNTER — Other Ambulatory Visit: Payer: Self-pay

## 2020-10-07 ENCOUNTER — Ambulatory Visit: Payer: Medicaid Other | Admitting: Neurology

## 2020-10-07 ENCOUNTER — Encounter: Payer: Self-pay | Admitting: Neurology

## 2020-10-07 ENCOUNTER — Ambulatory Visit (INDEPENDENT_AMBULATORY_CARE_PROVIDER_SITE_OTHER): Payer: Medicaid Other | Admitting: Obstetrics

## 2020-10-07 VITALS — BP 120/70 | Wt 163.0 lb

## 2020-10-07 VITALS — BP 122/88 | HR 112 | Ht 62.0 in | Wt 163.0 lb

## 2020-10-07 DIAGNOSIS — G43009 Migraine without aura, not intractable, without status migrainosus: Secondary | ICD-10-CM

## 2020-10-07 DIAGNOSIS — F908 Attention-deficit hyperactivity disorder, other type: Secondary | ICD-10-CM

## 2020-10-07 DIAGNOSIS — Z3A33 33 weeks gestation of pregnancy: Secondary | ICD-10-CM | POA: Diagnosis not present

## 2020-10-07 DIAGNOSIS — O0993 Supervision of high risk pregnancy, unspecified, third trimester: Secondary | ICD-10-CM

## 2020-10-07 DIAGNOSIS — Z3480 Encounter for supervision of other normal pregnancy, unspecified trimester: Secondary | ICD-10-CM

## 2020-10-07 LAB — POCT URINALYSIS DIPSTICK OB
Glucose, UA: NEGATIVE
POC,PROTEIN,UA: NEGATIVE

## 2020-10-07 NOTE — Progress Notes (Signed)
Routine Prenatal Care Visit  Subjective  Mary Crane Register is a 20 y.o. G1P0000 at [redacted]w[redacted]d being seen today for ongoing prenatal care.  She is currently monitored for the following issues for this high-risk pregnancy and has Abnormal celiac antibody panel; Gluten intolerance; Anaphylactic reaction; ADHD (attention deficit hyperactivity disorder); Supervision of high risk pregnancy, antepartum; Marijuana user; Headache in pregnancy, antepartum; Generalized anxiety disorder with panic attacks; and Bipolar depression (HCC) on their problem list.  ----------------------------------------------------------------------------------- Patient reports that her baby has not been moving as much over the last 4-5 days. She is a smoker, and last ate about 2 hours ago.    Contractions: Not present. Vag. Bleeding: None.  Movement: (!) Decreased. Leaking Fluid denies.  ----------------------------------------------------------------------------------- The following portions of the patient's history were reviewed and updated as appropriate: allergies, current medications, past family history, past medical history, past social history, past surgical history and problem list. Problem list updated.  Objective  Blood pressure 120/70, weight 163 lb (73.9 kg), last menstrual period 01/09/2020. Pregravid weight 144 lb (65.3 kg) Total Weight Gain 19 lb (8.618 kg) Urinalysis: Urine Protein Negative  Urine Glucose Negative  Fetal Status:     Movement: (!) Decreased     General:  Alert, oriented and cooperative. Patient is in no acute distress.  Skin: Skin is warm and dry. No rash noted.   Cardiovascular: Normal heart rate noted  Respiratory: Normal respiratory effort, no problems with respiration noted  Abdomen: Soft, gravid, appropriate for gestational age. Pain/Pressure: Absent     Pelvic:  Cervical exam deferred        Extremities: Normal range of motion.     Mental Status: Normal mood and affect. Normal  behavior. Normal judgment and thought content.   Assessment   20 y.o. G1P0000 at [redacted]w[redacted]d by  11/23/2020, by Ultrasound presenting for routine prenatal visit Decreased fetal movement  Plan   FIRST Problems (from 04/06/20 to present)    Problem Noted Resolved   Marijuana user 04/14/2020 by Mirna Mires, CNM No   Supervision of high risk pregnancy, antepartum 04/06/2020 by Mirna Mires, CNM No   Overview Addendum 09/30/2020  9:21 AM by Mirna Mires, CNM     Nursing Staff Provider  Office Location  [facility] Dating   9w [place]  Language  English Anatomy US   complete, normal  Flu Vaccine   10/21 Genetic Screen  NIPS: neg x3, XX  TDaP vaccine   09/22/20 Hgb A1C or  GTT Third trimester : 118  Rhogam   09/08/20   LAB RESULTS   Feeding Plan  breast  Blood Type O/Negative/-- (12/06 1150)   Contraception  depo Antibody Positive, See Final Results (05/04 1543)  Circumcision  Rubella 9.39 (12/06 1150)  Pediatrician   RPR Non Reactive (05/04 1543)   Support Person  Fiance Casimiro Needle HBsAg Negative (12/06 1150)   Prenatal Classes  Resources provided 09/22/20 HIV Non Reactive (05/04 1543)    Varicella  immune  BTL Consent  n/a GBS  (For PCN allergy, check sensitivities)        VBAC Consent  n/a Pap  04/06/20 NILM    Hgb Electro      CF      SMA         ZOn Lmamictal and Seroquel- growth scans ordered- 73% at 32 weeks.         Previous Version       Preterm labor symptoms and general obstetric precautions including but not  limited to vaginal bleeding, contractions, leaking of fluid and fetal movement were reviewed in detail with the patient. Please refer to After Visit Summary for other counseling recommendations.  Blood drawn for Lamotrigine level. NST today: Reactive with two accels noted. Moderate variability noted. She is strongly encouraged to quit smoking.  Return in about 2 weeks (around 10/21/2020) for return OB.  Mirna Mires, CNM  10/07/2020 3:53 PM

## 2020-10-07 NOTE — Patient Instructions (Signed)
If you feel that you need a daily medication to try and further reduce headache frequency, ask your OBGYN if it would be okay to take cyproheptadine.  It is an antihistamine that is often used in pregnancy if a medication is required.  If it is okay, contact me.

## 2020-10-08 ENCOUNTER — Telehealth: Payer: Self-pay

## 2020-10-08 ENCOUNTER — Telehealth: Payer: Self-pay | Admitting: Neurology

## 2020-10-08 LAB — LAMOTRIGINE LEVEL: Lamotrigine Lvl: <1 ug/mL — ABNORMAL LOW (ref 2.0–20.0)

## 2020-10-08 NOTE — Telephone Encounter (Signed)
New med rx by [facility]rology[facility] if safe prior to starting. No other details given. ZJ#[phone]

## 2020-10-08 NOTE — Telephone Encounter (Signed)
Patient called in stating she heard from her OBGYN that she is ok to take the Cyproheptadine

## 2020-10-08 NOTE — Telephone Encounter (Signed)
Spoke w/patient. Cyproheptadine (antihistamine) is the medicine she was given for migraines.

## 2020-10-09 ENCOUNTER — Other Ambulatory Visit: Payer: Self-pay | Admitting: Neurology

## 2020-10-09 MED ORDER — CYPROHEPTADINE HCL 4 MG PO TABS: 4.0000 mg | ORAL_TABLET | Freq: Three times a day (TID) | ORAL | 1 refills | Status: DC

## 2020-10-09 NOTE — Progress Notes (Signed)
Cyproheptadine 4mg  three times daily

## 2020-10-09 NOTE — Telephone Encounter (Signed)
Pt advised script at the pharmacy.  Per pt the pharmacy has to order the medication she will be able to pick up on Tuesday of next due to Monday will be a holiday.

## 2020-10-09 NOTE — Addendum Note (Signed)
Addended by: Mirna Mires on: 10/09/2020 04:45 PM   Modules accepted: Orders

## 2020-10-09 NOTE — Telephone Encounter (Signed)
I sent a prescription for cyproheptadine 4mg  three times daily to 

## 2020-10-13 ENCOUNTER — Telehealth: Payer: Self-pay

## 2020-10-13 NOTE — Telephone Encounter (Signed)
Pt calling; is having stomach pains when she tried to eat which make it hard for her to eat especially with being super preg.  What to do?  [phone]

## 2020-10-13 NOTE — Telephone Encounter (Signed)
Pt aware; also adv to have someone elevate HOB 4-6in.; lie on right side as stomach empties out on the right; drink 1/4 cup milk before going to bed; avoid fatty, spicy, fried food.  If no better to d/w provider at next visit.

## 2020-10-15 ENCOUNTER — Other Ambulatory Visit: Payer: Self-pay | Admitting: Obstetrics

## 2020-10-15 DIAGNOSIS — O0993 Supervision of high risk pregnancy, unspecified, third trimester: Secondary | ICD-10-CM

## 2020-10-20 ENCOUNTER — Ambulatory Visit (INDEPENDENT_AMBULATORY_CARE_PROVIDER_SITE_OTHER): Payer: Medicaid Other | Admitting: Obstetrics

## 2020-10-20 ENCOUNTER — Other Ambulatory Visit: Payer: Self-pay

## 2020-10-20 VITALS — BP 118/66 | Wt 164.0 lb

## 2020-10-20 DIAGNOSIS — Z3A35 35 weeks gestation of pregnancy: Secondary | ICD-10-CM

## 2020-10-20 DIAGNOSIS — Z3480 Encounter for supervision of other normal pregnancy, unspecified trimester: Secondary | ICD-10-CM

## 2020-10-20 LAB — POCT URINALYSIS DIPSTICK OB: Glucose, UA: NEGATIVE

## 2020-10-20 NOTE — Progress Notes (Signed)
Pressure since last visit.

## 2020-10-20 NOTE — Progress Notes (Signed)
Routine Prenatal Care Visit  Subjective  Mary Crane is a 20 y.o. G1P0000 at [redacted]w[redacted]d being seen today for ongoing prenatal care.  She is currently monitored for the following issues for this high-risk pregnancy and has Abnormal celiac antibody panel; Gluten intolerance; Anaphylactic reaction; ADHD (attention deficit hyperactivity disorder); Supervision of high risk pregnancy, antepartum; Marijuana user; Headache in pregnancy, antepartum; Generalized anxiety disorder with panic attacks; and Bipolar depression (HCC) on their problem list.  ----------------------------------------------------------------------------------- Patient reports no complaints.   Contractions: Not present. Vag. Bleeding: None.  Movement: Present. Leaking Fluid denies.  ----------------------------------------------------------------------------------- The following portions of the patient's history were reviewed and updated as appropriate: allergies, current medications, past family history, past medical history, past social history, past surgical history and problem list. Problem list updated.  Objective  Blood pressure 118/66, weight 164 lb (74.4 kg), last menstrual period 01/09/2020. Pregravid weight 144 lb (65.3 kg) Total Weight Gain 20 lb (9.072 kg) Urinalysis: Urine Protein Trace  Urine Glucose Negative  Fetal Status:     Movement: Present     General:  Alert, oriented and cooperative. Patient is in no acute distress.  Skin: Skin is warm and dry. No rash noted.   Cardiovascular: Normal heart rate noted  Respiratory: Normal respiratory effort, no problems with respiration noted  Abdomen: Soft, gravid, appropriate for gestational age. Pain/Pressure: Present     Pelvic:  Cervical exam deferred        Extremities: Normal range of motion.     Mental Status: Normal mood and affect. Normal behavior. Normal judgment and thought content.   Assessment   20 y.o. G1P0000 at [redacted]w[redacted]d by  11/23/2020, by  Ultrasound presenting for routine prenatal visit  Plan   FIRST Problems (from 04/06/20 to present)    Problem Noted Resolved   Marijuana user 04/14/2020 by Mirna Mires, CNM No   Supervision of high risk pregnancy, antepartum 04/06/2020 by Mirna Mires, CNM No   Overview Addendum 09/30/2020  9:21 AM by Mirna Mires, CNM     Nursing Staff Provider  Office Location  [facility] Dating   9w [place]  Language  English Anatomy US   complete, normal  Flu Vaccine   10/21 Genetic Screen  NIPS: neg x3, XX  TDaP vaccine   09/22/20 Hgb A1C or  GTT Third trimester : 118  Rhogam   09/08/20   LAB RESULTS   Feeding Plan  breast  Blood Type O/Negative/-- (12/06 1150)   Contraception  depo Antibody Positive, See Final Results (05/04 1543)  Circumcision  Rubella 9.39 (12/06 1150)  Pediatrician   RPR Non Reactive (05/04 1543)   Support Person  Fiance Casimiro Needle HBsAg Negative (12/06 1150)   Prenatal Classes  Resources provided 09/22/20 HIV Non Reactive (05/04 1543)    Varicella  immune  BTL Consent  n/a GBS  (For PCN allergy, check sensitivities)        VBAC Consent  n/a Pap  04/06/20 NILM    Hgb Electro      CF      SMA         ZOn Lmamictal and Seroquel- growth scans ordered- 73% at 32 weeks.         Previous Version       Preterm labor symptoms and general obstetric precautions including but not limited to vaginal bleeding, contractions, leaking of fluid and fetal movement were reviewed in detail with the patient. Please refer to After Visit Summary for other counseling recommendations.  No follow-ups on file.  Mirna Mires, CNM  10/20/2020 3:46 PM

## 2020-10-21 ENCOUNTER — Telehealth: Payer: Self-pay

## 2020-10-21 NOTE — Telephone Encounter (Signed)
Pt calling; is 35wks; today around lunch time she started having bad back pain which is not unusual for her; is also having sharpe lower abd pain which is unusual for her; pain comes and goes.  [phone]  Pt denies ctxs and UIT sxs.  Adv will d/w provider and get back with her.

## 2020-10-21 NOTE — Telephone Encounter (Signed)
If she think she is in labor have her go to L&D

## 2020-10-22 NOTE — Telephone Encounter (Signed)
Pt states she is feeling a lot better today.  Explained diff between true ctxs and B-H ctxs; labor precautions given; pt states she does have pressure that she mostly notices with walking a lot, doing housework, getting groceries; adv if pressure becomes constant she needs to be seen.

## 2020-10-26 ENCOUNTER — Other Ambulatory Visit: Payer: Self-pay

## 2020-10-26 ENCOUNTER — Ambulatory Visit (INDEPENDENT_AMBULATORY_CARE_PROVIDER_SITE_OTHER): Payer: Medicaid Other | Admitting: Obstetrics

## 2020-10-26 VITALS — BP 120/80 | Wt 167.0 lb

## 2020-10-26 DIAGNOSIS — Z3480 Encounter for supervision of other normal pregnancy, unspecified trimester: Secondary | ICD-10-CM

## 2020-10-26 DIAGNOSIS — Z3A36 36 weeks gestation of pregnancy: Secondary | ICD-10-CM

## 2020-10-26 DIAGNOSIS — O0993 Supervision of high risk pregnancy, unspecified, third trimester: Secondary | ICD-10-CM

## 2020-10-26 NOTE — Progress Notes (Signed)
Routine Prenatal Care Visit  Subjective  Mary Crane is a 20 y.o. G1P0000 at [redacted]w[redacted]d being seen today for ongoing prenatal care.  She is currently monitored for the following issues for this high-risk pregnancy and has Abnormal celiac antibody panel; Gluten intolerance; Anaphylactic reaction; ADHD (attention deficit hyperactivity disorder); Supervision of high risk pregnancy, antepartum; Marijuana user; Headache in pregnancy, antepartum; Generalized anxiety disorder with panic attacks; and Bipolar depression (HCC) on their problem list.  ----------------------------------------------------------------------------------- Patient reports no bleeding, no contractions, no cramping, no leaking and but she does complain of constipation.   Contractions: Not present. Vag. Bleeding: None.  Movement: Present. Leaking Fluid denies.  ----------------------------------------------------------------------------------- The following portions of the patient's history were reviewed and updated as appropriate: allergies, current medications, past family history, past medical history, past social history, past surgical history and problem list. Problem list updated.  Objective  Blood pressure 120/80, weight 167 lb (75.8 kg), last menstrual period 01/09/2020. Pregravid weight 144 lb (65.3 kg) Total Weight Gain 23 lb (10.4 kg) Urinalysis: Urine Protein    Urine Glucose    Fetal Status:     Movement: Present     General:  Alert, oriented and cooperative. Patient is in no acute distress.  Skin: Skin is warm and dry. No rash noted.   Cardiovascular: Normal heart rate noted  Respiratory: Normal respiratory effort, no problems with respiration noted  Abdomen: Soft, gravid, appropriate for gestational age. Pain/Pressure: Present     Pelvic:  Cervical exam performed      FT dilate/50%/-3 cervix is firm.  Extremities: Normal range of motion.     Mental Status: Normal mood and affect. Normal behavior.  Normal judgment and thought content.   Assessment   20 y.o. G1P0000 at [redacted]w[redacted]d by  11/23/2020, by Ultrasound presenting for routine prenatal visit  Plan   FIRST Problems (from 04/06/20 to present)    Problem Noted Resolved   Marijuana user 04/14/2020 by Mirna Mires, CNM No   Supervision of high risk pregnancy, antepartum 04/06/2020 by Mirna Mires, CNM No   Overview Addendum 09/30/2020  9:21 AM by Mirna Mires, CNM     Nursing Staff Provider  Office Location  [facility] Dating   9w [place]  Language  English Anatomy US   complete, normal  Flu Vaccine   10/21 Genetic Screen  NIPS: neg x3, XX  TDaP vaccine   09/22/20 Hgb A1C or  GTT Third trimester : 118  Rhogam   09/08/20   LAB RESULTS   Feeding Plan  breast  Blood Type O/Negative/-- (12/06 1150)   Contraception  depo Antibody Positive, See Final Results (05/04 1543)  Circumcision  Rubella 9.39 (12/06 1150)  Pediatrician   RPR Non Reactive (05/04 1543)   Support Person  Fiance Casimiro Needle HBsAg Negative (12/06 1150)   Prenatal Classes  Resources provided 09/22/20 HIV Non Reactive (05/04 1543)    Varicella  immune  BTL Consent  n/a GBS  (For PCN allergy, check sensitivities)        VBAC Consent  n/a Pap  04/06/20 NILM    Hgb Electro      CF      SMA         ZOn Lmamictal and Seroquel- growth scans ordered- 73% at 32 weeks.              Preterm labor symptoms and general obstetric precautions including but not limited to vaginal bleeding, contractions, leaking of fluid and fetal movement were reviewed  in detail with the patient. Please refer to After Visit Summary for other counseling recommendations.  GBS culture retrieved today.  No follow-ups on file.  Mirna Mires, CNM  10/26/2020 4:18 PM

## 2020-10-28 ENCOUNTER — Encounter: Payer: Self-pay | Admitting: Obstetrics & Gynecology

## 2020-10-28 ENCOUNTER — Telehealth: Payer: Self-pay

## 2020-10-28 ENCOUNTER — Observation Stay
Admission: EM | Admit: 2020-10-28 | Discharge: 2020-10-28 | Disposition: A | Discharge status: Home / Self Care | Payer: Medicaid Other | Attending: Obstetrics & Gynecology | Admitting: Obstetrics & Gynecology

## 2020-10-28 ENCOUNTER — Other Ambulatory Visit: Payer: Self-pay

## 2020-10-28 DIAGNOSIS — O368930 Maternal care for other specified fetal problems, third trimester, not applicable or unspecified: Secondary | ICD-10-CM | POA: Diagnosis not present

## 2020-10-28 DIAGNOSIS — O26893 Other specified pregnancy related conditions, third trimester: Secondary | ICD-10-CM | POA: Insufficient documentation

## 2020-10-28 DIAGNOSIS — O4703 False labor before 37 completed weeks of gestation, third trimester: Secondary | ICD-10-CM | POA: Diagnosis not present

## 2020-10-28 DIAGNOSIS — N941 Unspecified dyspareunia: Secondary | ICD-10-CM | POA: Insufficient documentation

## 2020-10-28 DIAGNOSIS — Z3A36 36 weeks gestation of pregnancy: Secondary | ICD-10-CM | POA: Insufficient documentation

## 2020-10-28 DIAGNOSIS — O0990 Supervision of high risk pregnancy, unspecified, unspecified trimester: Secondary | ICD-10-CM

## 2020-10-28 DIAGNOSIS — F17210 Nicotine dependence, cigarettes, uncomplicated: Secondary | ICD-10-CM | POA: Diagnosis not present

## 2020-10-28 DIAGNOSIS — O479 False labor, unspecified: Secondary | ICD-10-CM | POA: Diagnosis present

## 2020-10-28 DIAGNOSIS — O99333 Smoking (tobacco) complicating pregnancy, third trimester: Secondary | ICD-10-CM | POA: Insufficient documentation

## 2020-10-28 DIAGNOSIS — F1290 Cannabis use, unspecified, uncomplicated: Secondary | ICD-10-CM

## 2020-10-28 NOTE — Telephone Encounter (Signed)
Pt calling; is having really intense lower back pain, lower abd pain, and hip pain; off an on; activity makes it worse.  [phone]  Explained diff between true ctxs and B-H ctxs; pt is having true ctxs but they are irreg; pt states she is unable to talk thru the pain; adv to go to L&D via ED.  Abby notified.

## 2020-10-28 NOTE — OB Triage Note (Signed)
Pt is a 20yo female G1P0 at [redacted]w[redacted]d. She arrived to the unit with complaints of contractions every 5-30 minutes. Pt denies vaginal bleeding and leaking of fluid. Pt states that she has had small amounts of white mucus discharge everyday for the past week. Pt reports positive fetal movement. Monitors applied and assessing. VS stable.

## 2020-10-28 NOTE — Discharge Summary (Signed)
Final Progress Note  Patient ID: Mary Crane MRN: [id number] DOB/AGE: 2000-08-31 20 y.o.  Admit date: 10/28/2020 Admitting provider: Imagene Riches, CNM Discharge date: 10/28/2020   Admission Diagnoses: Irregular contractions in pregnancy Discharge Diagnoses:  Active Problems:   Irregular uterine contractions  Reactive fetal heart tones Postcoital cramping  History of Present Illness: The patient is a 20 y.o. female G1P0000 at 35w2dwho presents for irregular cramping that started today. She . Reports having had intercourse last evening, and then has had some cramping since this morning. She denies any LOF, vaginal bleeding. Her baby is moving well. She was seen in the office just two days ago and was a FT dilated.   Past Medical History:  Diagnosis Date   Abdominal pain    Abdominal pain    Abnormal celiac antibody panel    Allergy    Anxiety    Bipolar depression (HCC)    Borderline personality disorder ([facility] [place]    Depression    Early satiety    Eczema    bilateral legs   Headache(784.0)    Nausea    Nausea    Panic disorder    Seasonal allergies    Vision abnormalities    Hx: of wears glasses    Past Surgical History:  Procedure Laterality Date   ADENOIDECTOMY     ESOPHAGOGASTRODUODENOSCOPY N/A 06/14/2013   Procedure: ESOPHAGOGASTRODUODENOSCOPY (EGD);  Surgeon: JOletha Blend MD;  [facility]  Service: Gastroenterology;  Laterality: N/A;   TONSILLECTOMY     WISDOM TOOTH EXTRACTION      No current facility-administered medications on file prior to encounter.   Current Outpatient Medications on File Prior to Encounter  Medication Sig Dispense Refill   acetaminophen (TYLENOL) 500 MG tablet Take 500 mg by mouth as needed.     cyproheptadine (PERIACTIN) 4 MG tablet Take 1 tablet (4 mg total) by mouth 3 (three) times daily. 90 tablet 1   lamoTRIgine 25 & 50 & 100 MG KIT Take 100 mg by mouth daily.     Prenatal Vit-Fe Fumarate-FA (PRENATAL VITAMINS)  28-0.8 MG TABS Take by mouth.     QUEtiapine (SEROQUEL) 300 MG tablet Take 1 tablet by mouth daily.      Allergies  Allergen Reactions   Azithromycin Anaphylaxis, Hives, Other (See Comments) and Itching    Trouble breathing Trouble breathing   Augmentin [Amoxicillin-Pot Clavulanate] Hives   Gluten Meal Nausea And Vomiting    Social History   Socioeconomic History   Marital status: Single    Spouse name: Not on file   Number of children: Not on file   Years of education: Not on file   Highest education level: Not on file  Occupational History   Not on file  Tobacco Use   Smoking status: Every Day    Packs/day: 0.50    Years: 1.50    Pack years: 0.75    Types: Cigarettes   Smokeless tobacco: Never  Vaping Use   Vaping Use: Former  Substance and Sexual Activity   Alcohol use: No   Drug use: Yes    Types: Marijuana    Comment: stopped 11/21   Sexual activity: Yes    Birth control/protection: None  Other Topics Concern   Not on file  Social History Narrative   ** Merged History Encounter **       9th grade 2014-2015   Right handed   Drinks caffeine   Lives in one story home  Currently [redacted] weeks pregnant   Social Determinants of Health   Financial Resource Strain: Not on file  Food Insecurity: Not on file  Transportation Needs: Not on file  Physical Activity: Not on file  Stress: Not on file  Social Connections: Not on file  Intimate Partner Violence: Not on file    Family History  Problem Relation Age of Onset   Cholelithiasis Maternal Grandmother    Hypertension Maternal Grandmother    Hyperlipidemia Maternal Grandmother    Thyroid disease Maternal Grandmother    Cholelithiasis Maternal Grandfather    Hypertension Maternal Grandfather    Heart Problems Maternal Grandfather    Hyperlipidemia Mother    Thyroid disease Mother    Celiac disease Neg Hx      Review of Systems  Constitutional: Negative.   HENT: Negative.    Eyes: Negative.    Respiratory: Negative.    Cardiovascular: Negative.   Gastrointestinal: Negative.   Genitourinary: Negative.   Musculoskeletal: Negative.   Skin: Negative.   Neurological: Negative.   Endo/Heme/Allergies: Negative.   Psychiatric/Behavioral: Negative.      Physical Exam: BP 127/82 (BP Location: Left Arm)   Pulse 100   Temp 98.5 F (36.9 C) (Oral)   Resp 16   LMP 01/09/2020 (Exact Date)   Physical Exam Constitutional:      Appearance: Normal appearance.  Genitourinary:     Vulva and rectum normal.     Genitourinary Comments: No vaginal discharge or bloody show ntoed. SVE: 1cm/505/-2. Cervix is quite posterior, firm.  HENT:     Head: Normocephalic and atraumatic.     Nose: Nose normal.  Cardiovascular:     Rate and Rhythm: Normal rate and regular rhythm.  Pulmonary:     Effort: Pulmonary effort is normal.     Breath sounds: Normal breath sounds.  Abdominal:     Palpations: Abdomen is soft.     Comments: Gravid abdomen. Palpates soft. No contractions palopated.  Musculoskeletal:        General: Normal range of motion.     Cervical back: Normal range of motion and neck supple.  Neurological:     General: No focal deficit present.     Mental Status: She is alert and oriented to person, place, and time.  Skin:    General: Skin is warm and dry.  Psychiatric:        Mood and Affect: Mood normal.        Behavior: Behavior normal.    Consults: None  Significant Findings/ Diagnostic Studies: none  Procedures: NST NST Baseline FHR: 130 baseline beats/min Variability: moderate Accelerations: present Decelerations: absent Tocometry: some uterine irritability noted  Interpretation:  INDICATIONS: rule out uterine contractions RESULTS:  A NST procedure was performed with FHR monitoring and a normal baseline established, appropriate time of 20-40 minutes of evaluation, and accels >2 seen w 15x15 characteristics.  Results show a REACTIVE NST.    Hospital Course: The  patient was admitted to Labor and Delivery Triage for observation. An NST was performed and found to be reactive. She was checked and found to not be in labor, but having some irregular cramping likely due to recent intercourse. Oral hydration with water was provided. As she was not contracting, and her NST was reactive, she was discharged home to be on pelvic rest until 37 weeks. She is encouraged to stay well hydrated, relax in a tub bath this evening, and follow up next week at her scheduled OB appointment.  Discharge Condition: good  Disposition: Discharge disposition: 01-Home or Self Care      Diet: Regular diet  Discharge Activity: No sex for 1.5 weeks  Discharge Instructions     Discharge activity:  No Restrictions   Complete by: As directed    Do not have sex or do anything that might make you have an orgasm   Complete by: As directed    Fetal Kick Count:  Lie on our left side for one hour after a meal, and count the number of times your baby kicks.  If it is less than 5 times, get up, move around and drink some juice.  Repeat the test 30 minutes later.  If it is still less than 5 kicks in an hour, notify your doctor.   Complete by: As directed    LABOR:  When conractions begin, you should start to time them from the beginning of one contraction to the beginning  of the next.  When contractions are 5 - 10 minutes apart or less and have been regular for at least an hour, you should call your health care provider.   Complete by: As directed    Notify physician for bleeding from the vagina   Complete by: As directed    Notify physician for blurring of vision or spots before the eyes   Complete by: As directed    Notify physician for chills or fever   Complete by: As directed    Notify physician for fainting spells, "black outs" or loss of consciousness   Complete by: As directed    Notify physician for increase in vaginal discharge   Complete by: As directed    Notify physician  for leaking of fluid   Complete by: As directed    Notify physician for pain or burning when urinating   Complete by: As directed    Notify physician for pelvic pressure (sudden increase)   Complete by: As directed    Notify physician for severe or continued nausea or vomiting   Complete by: As directed    Notify physician for sudden gushing of fluid from the vagina (with or without continued leaking)   Complete by: As directed    Notify physician for sudden, constant, or occasional abdominal pain   Complete by: As directed    Notify physician if baby moving less than usual   Complete by: As directed       Allergies as of 10/28/2020       Reactions   Azithromycin Anaphylaxis, Hives, Other (See Comments), Itching   Trouble breathing Trouble breathing   Augmentin [amoxicillin-pot Clavulanate] Hives   Gluten Meal Nausea And Vomiting        Medication List     TAKE these medications    acetaminophen 500 MG tablet Commonly known as: TYLENOL Take 500 mg by mouth as needed.   cyproheptadine 4 MG tablet Commonly known as: PERIACTIN Take 1 tablet (4 mg total) by mouth 3 (three) times daily.   lamoTRIgine 25 & 50 & 100 MG Kit Take 100 mg by mouth daily.   Prenatal Vitamins 28-0.8 MG Tabs Take by mouth.   QUEtiapine 300 MG tablet Commonly known as: SEROQUEL Take 1 tablet by mouth daily.         Total time spent taking care of this patient: 20 minutes  Signed: Imagene Riches, CNM  10/28/2020, 6:23 PM

## 2020-10-28 NOTE — OB Triage Note (Signed)
Pt discharged home in stable condition. CNM reviewed labor precautions and discharge instructions with patient. Pt verbalized understanding.

## 2020-10-30 LAB — CULTURE, BETA STREP (GROUP B ONLY): Strep Gp B Culture: NEGATIVE

## 2020-11-03 ENCOUNTER — Other Ambulatory Visit: Payer: Self-pay

## 2020-11-03 ENCOUNTER — Ambulatory Visit (INDEPENDENT_AMBULATORY_CARE_PROVIDER_SITE_OTHER): Payer: Medicaid Other | Admitting: Obstetrics

## 2020-11-03 VITALS — BP 120/70 | Wt 167.0 lb

## 2020-11-03 DIAGNOSIS — O0990 Supervision of high risk pregnancy, unspecified, unspecified trimester: Secondary | ICD-10-CM

## 2020-11-03 NOTE — Progress Notes (Signed)
Routine Prenatal Care Visit  Subjective  Mary Crane is a 20 y.o. G1P0000 at [redacted]w[redacted]d being seen today for ongoing prenatal care.  She is currently monitored for the following issues for this high-risk pregnancy and has Abnormal celiac antibody panel; Gluten intolerance; Anaphylactic reaction; ADHD (attention deficit hyperactivity disorder); Supervision of high risk pregnancy, antepartum; Marijuana user; Headache in pregnancy, antepartum; Generalized anxiety disorder with panic attacks; Bipolar depression (HCC); and Irregular uterine contractions on their problem list.  ----------------------------------------------------------------------------------- Patient reports no complaints.   Contractions: Irregular. Vag. Bleeding: None.  Movement: Present. Leaking Fluid denies.  ----------------------------------------------------------------------------------- The following portions of the patient's history were reviewed and updated as appropriate: allergies, current medications, past family history, past medical history, past social history, past surgical history and problem list. Problem list updated.  Objective  Blood pressure 120/70, weight 167 lb (75.8 kg), last menstrual period 01/09/2020. Pregravid weight 144 lb (65.3 kg) Total Weight Gain 23 lb (10.4 kg) Urinalysis: Urine Protein    Urine Glucose    Fetal Status:     Movement: Present     General:  Alert, oriented and cooperative. Patient is in no acute distress.  Skin: Skin is warm and dry. No rash noted.   Cardiovascular: Normal heart rate noted  Respiratory: Normal respiratory effort, no problems with respiration noted  Abdomen: Soft, gravid, appropriate for gestational age. Pain/Pressure: Present     Pelvic:  Cervical exam performed      1.5cms/70%/-2. Cervix is posterior  Extremities: Normal range of motion.     Mental Status: Normal mood and affect. Normal behavior. Normal judgment and thought content.   Assessment    20 y.o. G1P0000 at [redacted]w[redacted]d by  11/23/2020, by Ultrasound presenting for routine prenatal visit  Plan   FIRST Problems (from 04/06/20 to present)    Problem Noted Resolved   Marijuana user 04/14/2020 by Mirna Mires, CNM No   Supervision of high risk pregnancy, antepartum 04/06/2020 by Mirna Mires, CNM No   Overview Addendum 11/02/2020 10:19 AM by Mirna Mires, CNM     Nursing Staff Provider  Office Location  [facility] Dating   9w [place]  Language  English Anatomy US   complete, normal  Flu Vaccine   10/21 Genetic Screen  NIPS: neg x3, XX  TDaP vaccine   09/22/20 Hgb A1C or  GTT Third trimester : 118  Rhogam   09/08/20   LAB RESULTS   Feeding Plan  breast  Blood Type O/Negative/-- (12/06 1150)   Contraception  depo Antibody Positive, See Final Results (05/04 1543)  Circumcision  Rubella 9.39 (12/06 1150)  Pediatrician   RPR Non Reactive (05/04 1543)   Support Person  Fiance Casimiro Needle HBsAg Negative (12/06 1150)   Prenatal Classes  Resources provided 09/22/20 HIV Non Reactive (05/04 1543)    Varicella  immune  BTL Consent  n/a GBS  (For PCN allergy, check sensitivities) Negative       VBAC Consent  n/a Pap  04/06/20 NILM    Hgb Electro      CF      SMA         ZOn Lmamictal and Seroquel- growth scans ordered- 73% at 32 weeks.              Term labor symptoms and general obstetric precautions including but not limited to vaginal bleeding, contractions, leaking of fluid and fetal movement were reviewed in detail with the patient. Please refer to After Visit Summary for other counseling  recommendations.   Return in about 1 week (around 11/10/2020) for return OB.  Mirna Mires, CNM  11/03/2020 5:08 PM

## 2020-11-03 NOTE — Progress Notes (Signed)
No complaints. Desires cervix check today. 

## 2020-11-04 ENCOUNTER — Other Ambulatory Visit: Payer: Self-pay | Admitting: Obstetrics

## 2020-11-04 DIAGNOSIS — F1290 Cannabis use, unspecified, uncomplicated: Secondary | ICD-10-CM

## 2020-11-04 DIAGNOSIS — O99333 Smoking (tobacco) complicating pregnancy, third trimester: Secondary | ICD-10-CM

## 2020-11-04 DIAGNOSIS — O09891 Supervision of other high risk pregnancies, first trimester: Secondary | ICD-10-CM

## 2020-11-04 DIAGNOSIS — R894 Abnormal immunological findings in specimens from other organs, systems and tissues: Secondary | ICD-10-CM

## 2020-11-10 ENCOUNTER — Other Ambulatory Visit: Payer: Self-pay

## 2020-11-10 ENCOUNTER — Ambulatory Visit: Payer: Medicaid Other | Attending: Obstetrics and Gynecology

## 2020-11-10 DIAGNOSIS — O99323 Drug use complicating pregnancy, third trimester: Secondary | ICD-10-CM | POA: Diagnosis not present

## 2020-11-10 DIAGNOSIS — O09891 Supervision of other high risk pregnancies, first trimester: Secondary | ICD-10-CM | POA: Diagnosis present

## 2020-11-10 DIAGNOSIS — O99333 Smoking (tobacco) complicating pregnancy, third trimester: Secondary | ICD-10-CM | POA: Insufficient documentation

## 2020-11-10 DIAGNOSIS — R894 Abnormal immunological findings in specimens from other organs, systems and tissues: Secondary | ICD-10-CM

## 2020-11-10 DIAGNOSIS — Z3689 Encounter for other specified antenatal screening: Secondary | ICD-10-CM | POA: Diagnosis not present

## 2020-11-10 DIAGNOSIS — Z3A38 38 weeks gestation of pregnancy: Secondary | ICD-10-CM | POA: Diagnosis not present

## 2020-11-10 DIAGNOSIS — F1290 Cannabis use, unspecified, uncomplicated: Secondary | ICD-10-CM | POA: Insufficient documentation

## 2020-11-12 ENCOUNTER — Ambulatory Visit (INDEPENDENT_AMBULATORY_CARE_PROVIDER_SITE_OTHER): Payer: Medicaid Other | Admitting: Obstetrics

## 2020-11-12 ENCOUNTER — Other Ambulatory Visit: Payer: Self-pay

## 2020-11-12 VITALS — BP 116/70 | Wt 170.8 lb

## 2020-11-12 DIAGNOSIS — Z3480 Encounter for supervision of other normal pregnancy, unspecified trimester: Secondary | ICD-10-CM

## 2020-11-12 DIAGNOSIS — Z3A38 38 weeks gestation of pregnancy: Secondary | ICD-10-CM

## 2020-11-12 NOTE — Progress Notes (Signed)
Routine Prenatal Care Visit  Subjective  Mary Crane is a 20 y.o. G1P0000 at [redacted]w[redacted]d being seen today for ongoing prenatal care.  She is currently monitored for the following issues for this high-risk pregnancy and has Abnormal celiac antibody panel; Gluten intolerance; Anaphylactic reaction; ADHD (attention deficit hyperactivity disorder); Supervision of high risk pregnancy, antepartum; Marijuana user; Headache in pregnancy, antepartum; Generalized anxiety disorder with panic attacks; Bipolar depression (HCC); and Irregular uterine contractions on their problem list.  ----------------------------------------------------------------------------------- Patient reports no complaints.   Contractions: Irritability. Vag. Bleeding: None.  Movement: Present. Leaking Fluid denies.  ----------------------------------------------------------------------------------- The following portions of the patient's history were reviewed and updated as appropriate: allergies, current medications, past family history, past medical history, past social history, past surgical history and problem list. Problem list updated.  Objective  Blood pressure 116/70, weight 170 lb 12.8 oz (77.5 kg), last menstrual period 01/09/2020. Pregravid weight 144 lb (65.3 kg) Total Weight Gain 26 lb 12.8 oz (12.2 kg) Urinalysis: Urine Protein    Urine Glucose    Fetal Status:     Movement: Present     General:  Alert, oriented and cooperative. Patient is in no acute distress.  Skin: Skin is warm and dry. No rash noted.   Cardiovascular: Normal heart rate noted  Respiratory: Normal respiratory effort, no problems with respiration noted  Abdomen: Soft, gravid, appropriate for gestational age. Pain/Pressure: Present     Pelvic:  Cervical exam performed      1.5/50/-3  Extremities: Normal range of motion.     Mental Status: Normal mood and affect. Normal behavior. Normal judgment and thought content.   Assessment   20  y.o. G1P0000 at [redacted]w[redacted]d by  11/23/2020, by Ultrasound presenting for routine prenatal visit  Plan   FIRST Problems (from 04/06/20 to present)    Problem Noted Resolved   Marijuana user 04/14/2020 by Mirna Mires, CNM No   Supervision of high risk pregnancy, antepartum 04/06/2020 by Mirna Mires, CNM No   Overview Addendum 11/02/2020 10:19 AM by Mirna Mires, CNM     Nursing Staff Provider  Office Location  [facility] Dating   9w [place]  Language  English Anatomy US   complete, normal  Flu Vaccine   10/21 Genetic Screen  NIPS: neg x3, XX  TDaP vaccine   09/22/20 Hgb A1C or  GTT Third trimester : 118  Rhogam   09/08/20   LAB RESULTS   Feeding Plan  breast  Blood Type O/Negative/-- (12/06 1150)   Contraception  depo Antibody Positive, See Final Results (05/04 1543)  Circumcision  Rubella 9.39 (12/06 1150)  Pediatrician   RPR Non Reactive (05/04 1543)   Support Person  Fiance Casimiro Needle HBsAg Negative (12/06 1150)   Prenatal Classes  Resources provided 09/22/20 HIV Non Reactive (05/04 1543)    Varicella  immune  BTL Consent  n/a GBS  (For PCN allergy, check sensitivities) Negative       VBAC Consent  n/a Pap  04/06/20 NILM    Hgb Electro      CF      SMA         ZOn Lmamictal and Seroquel- growth scans ordered- 73% at 32 weeks.              Term labor symptoms and general obstetric precautions including but not limited to vaginal bleeding, contractions, leaking of fluid and fetal movement were reviewed in detail with the patient. Please refer to After Visit Summary for other  counseling recommendations.   Return in about 1 week (around 11/19/2020) for return OB.  Mirna Mires, CNM  11/12/2020 4:22 PM

## 2020-11-17 ENCOUNTER — Other Ambulatory Visit: Payer: Self-pay

## 2020-11-17 ENCOUNTER — Ambulatory Visit (INDEPENDENT_AMBULATORY_CARE_PROVIDER_SITE_OTHER): Payer: Medicaid Other | Admitting: Obstetrics

## 2020-11-17 VITALS — BP 116/64 | Wt 172.0 lb

## 2020-11-17 DIAGNOSIS — Z3480 Encounter for supervision of other normal pregnancy, unspecified trimester: Secondary | ICD-10-CM

## 2020-11-17 DIAGNOSIS — Z3A39 39 weeks gestation of pregnancy: Secondary | ICD-10-CM

## 2020-11-17 LAB — POCT URINALYSIS DIPSTICK OB
Glucose, UA: NEGATIVE
POC,PROTEIN,UA: NEGATIVE

## 2020-11-17 NOTE — Progress Notes (Signed)
Routine Prenatal Care Visit  Subjective  Mary Crane is a 20 y.o. G1P0000 at [redacted]w[redacted]d being seen today for ongoing prenatal care.  She is currently monitored for the following issues for this high-risk pregnancy and has Abnormal celiac antibody panel; Gluten intolerance; Anaphylactic reaction; ADHD (attention deficit hyperactivity disorder); Supervision of high risk pregnancy, antepartum; Marijuana user; Headache in pregnancy, antepartum; Generalized anxiety disorder with panic attacks; Bipolar depression (HCC); and Irregular uterine contractions on their problem list.  ----------------------------------------------------------------------------------- Patient reports no bleeding, no leaking and occasional contractions.   Contractions: Irritability. Vag. Bleeding: None.  Movement: Present. Leaking Fluid denies.  ----------------------------------------------------------------------------------- The following portions of the patient's history were reviewed and updated as appropriate: allergies, current medications, past family history, past medical history, past social history, past surgical history and problem list. Problem list updated.  Objective  Blood pressure 116/64, weight 172 lb (78 kg), last menstrual period 01/09/2020. Pregravid weight 144 lb (65.3 kg) Total Weight Gain 28 lb (12.7 kg) Urinalysis: Urine Protein Negative  Urine Glucose Negative  Fetal Status:     Movement: Present     General:  Alert, oriented and cooperative. Patient is in no acute distress.  Skin: Skin is warm and dry. No rash noted.   Cardiovascular: Normal heart rate noted  Respiratory: Normal respiratory effort, no problems with respiration noted  Abdomen: Soft, gravid, appropriate for gestational age. Pain/Pressure: Present     Pelvic:  Cervical exam performed      2.5cms/70-8-%/-2. Still posterior, and needs to soften.  Extremities: Normal range of motion.     Mental Status: Normal mood and  affect. Normal behavior. Normal judgment and thought content.   Assessment   20 y.o. G1P0000 at [redacted]w[redacted]d by  11/23/2020, by Ultrasound presenting for routine prenatal visit  Plan   FIRST Problems (from 04/06/20 to present)    Problem Noted Resolved   Marijuana user 04/14/2020 by Mirna Mires, CNM No   Supervision of high risk pregnancy, antepartum 04/06/2020 by Mirna Mires, CNM No   Overview Addendum 11/02/2020 10:19 AM by Mirna Mires, CNM     Nursing Staff Provider  Office Location  [facility] Dating   9w [place]  Language  English Anatomy US   complete, normal  Flu Vaccine   10/21 Genetic Screen  NIPS: neg x3, XX  TDaP vaccine   09/22/20 Hgb A1C or  GTT Third trimester : 118  Rhogam   09/08/20   LAB RESULTS   Feeding Plan  breast  Blood Type O/Negative/-- (12/06 1150)   Contraception  depo Antibody Positive, See Final Results (05/04 1543)  Circumcision  Rubella 9.39 (12/06 1150)  Pediatrician   RPR Non Reactive (05/04 1543)   Support Person  Fiance Casimiro Needle HBsAg Negative (12/06 1150)   Prenatal Classes  Resources provided 09/22/20 HIV Non Reactive (05/04 1543)    Varicella  immune  BTL Consent  n/a GBS  (For PCN allergy, check sensitivities) Negative       VBAC Consent  n/a Pap  04/06/20 NILM    Hgb Electro      CF      SMA         ZOn Lmamictal and Seroquel- growth scans ordered- 73% at 32 weeks.              Term labor symptoms and general obstetric precautions including but not limited to vaginal bleeding, contractions, leaking of fluid and fetal movement were reviewed in detail with the patient. Please refer to  After Visit Summary for other counseling recommendations.  Cervical sweep performed at her request.   Return in about 1 week (around 11/24/2020) for return OB, discuss IOL.  Mirna Mires, CNM  11/17/2020 4:22 PM

## 2020-11-17 NOTE — Progress Notes (Signed)
Pelvic pressure and mild cramping. May have lost some of mucus plug. Desires cervix check today.

## 2020-11-23 NOTE — Telephone Encounter (Signed)
If induction was discussed with her then that is something that can be set up or discussed at her next appointment or Claris Che can call her if she discussed it with her last week

## 2020-11-23 NOTE — Telephone Encounter (Signed)
Pt calling; is 40w today; not sure if having signs of labor; wants to schedule IOL.  [phone]  Pt states she is physically exhausted; has irreg ctxs; has bad right hip pain that comes and goes with cleaning and sitting; has sharpe pain in pelvic area - ?lightning crotch.  Adv pt to not worry about the house; just rest and stay hydrated; will send msg to [facility]MS (B/U call); pt would like an earlier appt than the 14th as she was seen on the 5th to discuss IOL.

## 2020-11-24 ENCOUNTER — Other Ambulatory Visit: Payer: Self-pay

## 2020-11-24 ENCOUNTER — Inpatient Hospital Stay: Payer: Medicaid Other | Admitting: Certified Registered"

## 2020-11-24 ENCOUNTER — Encounter: Payer: Self-pay | Admitting: Obstetrics & Gynecology

## 2020-11-24 ENCOUNTER — Inpatient Hospital Stay
Admission: EM | Admit: 2020-11-24 | Discharge: 2020-11-26 | DRG: 807 | Disposition: A | Discharge status: Home / Self Care | Payer: Medicaid Other | Attending: Obstetrics and Gynecology | Admitting: Obstetrics and Gynecology

## 2020-11-24 DIAGNOSIS — O99344 Other mental disorders complicating childbirth: Secondary | ICD-10-CM | POA: Diagnosis present

## 2020-11-24 DIAGNOSIS — Z3A40 40 weeks gestation of pregnancy: Secondary | ICD-10-CM | POA: Diagnosis not present

## 2020-11-24 DIAGNOSIS — F319 Bipolar disorder, unspecified: Secondary | ICD-10-CM | POA: Diagnosis present

## 2020-11-24 DIAGNOSIS — Z880 Allergy status to penicillin: Secondary | ICD-10-CM | POA: Diagnosis not present

## 2020-11-24 DIAGNOSIS — F410 Panic disorder [episodic paroxysmal anxiety] without agoraphobia: Secondary | ICD-10-CM | POA: Diagnosis present

## 2020-11-24 DIAGNOSIS — F17210 Nicotine dependence, cigarettes, uncomplicated: Secondary | ICD-10-CM | POA: Diagnosis present

## 2020-11-24 DIAGNOSIS — O99334 Smoking (tobacco) complicating childbirth: Secondary | ICD-10-CM | POA: Diagnosis present

## 2020-11-24 DIAGNOSIS — F1290 Cannabis use, unspecified, uncomplicated: Secondary | ICD-10-CM | POA: Diagnosis present

## 2020-11-24 DIAGNOSIS — Z20822 Contact with and (suspected) exposure to covid-19: Secondary | ICD-10-CM | POA: Diagnosis present

## 2020-11-24 DIAGNOSIS — O26893 Other specified pregnancy related conditions, third trimester: Secondary | ICD-10-CM | POA: Diagnosis present

## 2020-11-24 DIAGNOSIS — O0990 Supervision of high risk pregnancy, unspecified, unspecified trimester: Secondary | ICD-10-CM

## 2020-11-24 DIAGNOSIS — O480 Post-term pregnancy: Secondary | ICD-10-CM | POA: Diagnosis not present

## 2020-11-24 DIAGNOSIS — O99324 Drug use complicating childbirth: Principal | ICD-10-CM | POA: Diagnosis present

## 2020-11-24 LAB — ABO/RH: ABO/RH(D): O NEG

## 2020-11-24 LAB — CHLAMYDIA/NGC RT PCR (ARMC ONLY)
Chlamydia Tr: NOT DETECTED
N gonorrhoeae: NOT DETECTED

## 2020-11-24 LAB — URINE DRUG SCREEN, QUALITATIVE (ARMC ONLY)
Amphetamines, Ur Screen: NOT DETECTED
Barbiturates, Ur Screen: NOT DETECTED
Benzodiazepine, Ur Scrn: NOT DETECTED
Cannabinoid 50 Ng, Ur ~~LOC~~: POSITIVE — AB
Cocaine Metabolite,Ur ~~LOC~~: NOT DETECTED
MDMA (Ecstasy)Ur Screen: NOT DETECTED
Methadone Scn, Ur: NOT DETECTED
Opiate, Ur Screen: NOT DETECTED
Phencyclidine (PCP) Ur S: NOT DETECTED
Tricyclic, Ur Screen: POSITIVE — AB

## 2020-11-24 LAB — CBC
HCT: 36 % (ref 36.0–46.0)
Hemoglobin: 12.3 g/dL (ref 12.0–15.0)
MCH: 30.2 pg (ref 26.0–34.0)
MCHC: 34.2 g/dL (ref 30.0–36.0)
MCV: 88.5 fL (ref 80.0–100.0)
Platelets: 341 10*3/uL (ref 150–400)
RBC: 4.07 MIL/uL (ref 3.87–5.11)
RDW: 14.1 % (ref 11.5–15.5)
WBC: 24.3 10*3/uL — ABNORMAL HIGH (ref 4.0–10.5)
nRBC: 0 % (ref 0.0–0.2)

## 2020-11-24 LAB — TYPE AND SCREEN
ABO/RH(D): O NEG
Antibody Screen: POSITIVE

## 2020-11-24 LAB — SARS CORONAVIRUS 2 (TAT 6-24 HRS): SARS Coronavirus 2: NEGATIVE

## 2020-11-24 MED ORDER — LACTATED RINGERS IV SOLN: INTRAVENOUS | Status: DC

## 2020-11-24 MED ORDER — IBUPROFEN 600 MG PO TABS
600.0000 mg | ORAL_TABLET | Freq: Four times a day (QID) | ORAL | Status: DC
  Administered 2020-11-25 – 2020-11-26 (×5): 600 mg via ORAL
  Filled 2020-11-24 (×5): qty 1

## 2020-11-24 MED ORDER — SIMETHICONE 80 MG PO CHEW: 80.0000 mg | CHEWABLE_TABLET | ORAL | Status: DC | PRN

## 2020-11-24 MED ORDER — OXYTOCIN BOLUS FROM INFUSION
333.0000 mL | Freq: Once | INTRAVENOUS | Status: AC
  Administered 2020-11-24: 333 mL via INTRAVENOUS

## 2020-11-24 MED ORDER — IBUPROFEN 600 MG PO TABS
600.0000 mg | ORAL_TABLET | Freq: Four times a day (QID) | ORAL | Status: DC
  Administered 2020-11-24: 600 mg via ORAL
  Filled 2020-11-24: qty 1

## 2020-11-24 MED ORDER — LIDOCAINE HCL (PF) 1 % IJ SOLN
INTRAMUSCULAR | Status: DC | PRN
  Administered 2020-11-24: 3 mL

## 2020-11-24 MED ORDER — ACETAMINOPHEN 325 MG PO TABS: 650.0000 mg | ORAL_TABLET | ORAL | Status: DC | PRN

## 2020-11-24 MED ORDER — BUTORPHANOL TARTRATE 1 MG/ML IJ SOLN
1.0000 mg | INTRAMUSCULAR | Status: DC | PRN
  Administered 2020-11-24 (×2): 1 mg via INTRAVENOUS
  Filled 2020-11-24 (×2): qty 1

## 2020-11-24 MED ORDER — LIDOCAINE-EPINEPHRINE (PF) 2 %-1:200000 IJ SOLN
INTRAMUSCULAR | Status: DC | PRN
  Administered 2020-11-24: 3 mL via INTRADERMAL

## 2020-11-24 MED ORDER — LACTATED RINGERS IV SOLN: 500.0000 mL | INTRAVENOUS | Status: DC | PRN

## 2020-11-24 MED ORDER — BUPIVACAINE HCL (PF) 0.25 % IJ SOLN
INTRAMUSCULAR | Status: DC | PRN
  Administered 2020-11-24: 2 mL via EPIDURAL
  Administered 2020-11-24: 4 mL via EPIDURAL

## 2020-11-24 MED ORDER — WITCH HAZEL-GLYCERIN EX PADS
1.0000 "application " | MEDICATED_PAD | CUTANEOUS | Status: DC | PRN
  Administered 2020-11-25: 1 via TOPICAL
  Filled 2020-11-24: qty 100

## 2020-11-24 MED ORDER — DIPHENHYDRAMINE HCL 25 MG PO CAPS
25.0000 mg | ORAL_CAPSULE | Freq: Four times a day (QID) | ORAL | Status: DC | PRN
Start: 2020-11-24 — End: 2020-11-26

## 2020-11-24 MED ORDER — DIBUCAINE (PERIANAL) 1 % EX OINT
1.0000 "application " | TOPICAL_OINTMENT | CUTANEOUS | Status: DC | PRN
  Administered 2020-11-25: 1 via RECTAL
  Filled 2020-11-24: qty 28

## 2020-11-24 MED ORDER — LAMOTRIGINE 100 MG PO TABS
100.0000 mg | ORAL_TABLET | Freq: Every day | ORAL | Status: DC
  Administered 2020-11-25: 100 mg via ORAL
  Filled 2020-11-24 (×3): qty 1

## 2020-11-24 MED ORDER — SENNOSIDES-DOCUSATE SODIUM 8.6-50 MG PO TABS: 2.0000 | ORAL_TABLET | ORAL | Status: DC

## 2020-11-24 MED ORDER — SOD CITRATE-CITRIC ACID 500-334 MG/5ML PO SOLN
30.0000 mL | ORAL | Status: DC | PRN
  Administered 2020-11-24: 30 mL via ORAL

## 2020-11-24 MED ORDER — FERROUS SULFATE 325 (65 FE) MG PO TABS
325.0000 mg | ORAL_TABLET | Freq: Two times a day (BID) | ORAL | Status: DC
  Administered 2020-11-25 – 2020-11-26 (×3): 325 mg via ORAL
  Filled 2020-11-24 (×3): qty 1

## 2020-11-24 MED ORDER — FENTANYL 2.5 MCG/ML W/ROPIVACAINE 0.15% IN NS 100 ML EPIDURAL (ARMC)
EPIDURAL | Status: AC
  Filled 2020-11-24: qty 100

## 2020-11-24 MED ORDER — COCONUT OIL OIL: 1.0000 "application " | TOPICAL_OIL | Status: DC | PRN

## 2020-11-24 MED ORDER — OXYTOCIN-SODIUM CHLORIDE 30-0.9 UT/500ML-% IV SOLN
2.5000 [IU]/h | INTRAVENOUS | Status: DC
  Administered 2020-11-24: 2.5 [IU]/h via INTRAVENOUS
  Filled 2020-11-24: qty 500

## 2020-11-24 MED ORDER — ONDANSETRON HCL 4 MG/2ML IJ SOLN: 4.0000 mg | INTRAMUSCULAR | Status: DC | PRN

## 2020-11-24 MED ORDER — FENTANYL 2.5 MCG/ML W/ROPIVACAINE 0.15% IN NS 100 ML EPIDURAL (ARMC)
EPIDURAL | Status: DC | PRN
  Administered 2020-11-24: 12 mL/h via EPIDURAL

## 2020-11-24 MED ORDER — PRENATAL MULTIVITAMIN CH
1.0000 | ORAL_TABLET | Freq: Every day | ORAL | Status: DC
  Administered 2020-11-25: 1 via ORAL
  Filled 2020-11-24: qty 1

## 2020-11-24 MED ORDER — HYDROCODONE-ACETAMINOPHEN 5-325 MG PO TABS: 1.0000 | ORAL_TABLET | Freq: Three times a day (TID) | ORAL | Status: DC | PRN

## 2020-11-24 MED ORDER — ONDANSETRON HCL 4 MG PO TABS: 4.0000 mg | ORAL_TABLET | ORAL | Status: DC | PRN

## 2020-11-24 MED ORDER — QUETIAPINE FUMARATE 300 MG PO TABS
300.0000 mg | ORAL_TABLET | Freq: Every day | ORAL | Status: DC
  Administered 2020-11-24 – 2020-11-25 (×2): 300 mg via ORAL
  Filled 2020-11-24 (×4): qty 1

## 2020-11-24 MED ORDER — ONDANSETRON HCL 4 MG/2ML IJ SOLN: 4.0000 mg | Freq: Four times a day (QID) | INTRAMUSCULAR | Status: DC | PRN

## 2020-11-24 MED ORDER — BENZOCAINE-MENTHOL 20-0.5 % EX AERO
1.0000 "application " | INHALATION_SPRAY | CUTANEOUS | Status: DC | PRN
  Administered 2020-11-25: 1 via TOPICAL
  Filled 2020-11-24 (×2): qty 56

## 2020-11-24 MED ORDER — LIDOCAINE HCL (PF) 1 % IJ SOLN
30.0000 mL | INTRAMUSCULAR | Status: AC | PRN
  Administered 2020-11-24: 30 mL via SUBCUTANEOUS
  Filled 2020-11-24: qty 30

## 2020-11-24 NOTE — Progress Notes (Signed)
1334 Pt requests epidural 1340 Mary Crane at [facility] 1350 Epidural placed 1352 test dose given Maternal HR 101 1354 Bolus 1 given  1400 bolus 2 given 1400 drip started

## 2020-11-24 NOTE — Anesthesia Procedure Notes (Signed)
Epidural Patient location during procedure: OB Start time: 11/24/2020 1:48 PM End time: 11/24/2020 2:00 PM  Staffing Anesthesiologist: Alver Fisher, MD Resident/CRNA: Elmarie Mainland, CRNA Performed: resident/CRNA   Preanesthetic Checklist Completed: patient identified, IV checked, site marked, risks and benefits discussed, surgical consent, monitors and equipment checked, pre-op evaluation and timeout performed  Epidural Patient position: sitting Prep: ChloraPrep Patient monitoring: heart rate, continuous pulse ox and blood pressure Approach: midline Location: L3-L4 Injection technique: LOR saline  Needle:  Needle type: Tuohy  Needle gauge: 17 G Needle length: 9 cm and 9 Needle insertion depth: 6 cm Catheter type: closed end flexible Catheter size: 19 Gauge Catheter at skin depth: 11 cm Test dose: negative and 1.5% lidocaine with Epi 1:200 K  Assessment Sensory level: T10 Events: blood not aspirated, injection not painful, no injection resistance, no paresthesia and negative IV test  Additional Notes 1 attempt Pt. Evaluated and documentation done after procedure finished. Patient identified. Risks/Benefits/Options discussed with patient including but not limited to bleeding, infection, nerve damage, paralysis, failed block, incomplete pain control, headache, blood pressure changes, nausea, vomiting, reactions to medication both or allergic, itching and postpartum back pain. Confirmed with bedside nurse the patient's most recent platelet count. Confirmed with patient that they are not currently taking any anticoagulation, have any bleeding history or any family history of bleeding disorders. Patient expressed understanding and wished to proceed. All questions were answered. Sterile technique was used throughout the entire procedure. Please see nursing notes for vital signs. Test dose was given through epidural catheter and negative prior to continuing to dose epidural or start  infusion. Warning signs of high block given to the patient including shortness of breath, tingling/numbness in hands, complete motor block, or any concerning symptoms with instructions to call for help. Patient was given instructions on fall risk and not to get out of bed. All questions and concerns addressed with instructions to call with any issues or inadequate analgesia.    Patient tolerated the insertion well without immediate complications.Reason for block:procedure for pain

## 2020-11-24 NOTE — Anesthesia Preprocedure Evaluation (Signed)
Anesthesia Evaluation  Patient identified by MRN, date of birth, ID band Patient awake    Reviewed: Allergy & Precautions, H&P , NPO status , Patient's Chart, lab work & pertinent test results  Airway Mallampati: II   Neck ROM: full    Dental no notable dental hx.    Pulmonary Current Smoker,           Cardiovascular negative cardio ROS       Neuro/Psych  Headaches, Anxiety Depression Bipolar Disorder    GI/Hepatic Neg liver ROS, GERD  ,  Endo/Other  negative endocrine ROS  Renal/GU negative Renal ROS  negative genitourinary   Musculoskeletal   Abdominal   Peds  Hematology negative hematology ROS (+)   Anesthesia Other Findings   Reproductive/Obstetrics (+) Pregnancy                             Anesthesia Physical Anesthesia Plan  ASA: 2  Anesthesia Plan: Epidural   Post-op Pain Management:    Induction:   PONV Risk Score and Plan:   Airway Management Planned:   Additional Equipment:   Intra-op Plan:   Post-operative Plan:   Informed Consent: I have reviewed the patients History and Physical, chart, labs and discussed the procedure including the risks, benefits and alternatives for the proposed anesthesia with the patient or authorized representative who has indicated his/her understanding and acceptance.       Plan Discussed with: CRNA and Anesthesiologist  Anesthesia Plan Comments:         Anesthesia Quick Evaluation

## 2020-11-24 NOTE — H&P (Signed)
Obstetric H&P   Chief Complaint: Contractions   Prenatal Care Provider: [facility]  History of Present Illness: 20 y.o. G1P0000 [redacted]w[redacted]d by 11/23/2020, by Ultrasound presenting to L&D with contractions starting this morning.  +FM, no LOF, no VB. Pregnancy uncomplicated other than psychiatric comorbidities on Lamictal and Seroquel.     Pregravid weight 65.3 kg Total Weight Gain 14.5 kg  FIRST Problems (from 04/06/20 to present)     Problem Noted Resolved   Marijuana user 04/14/2020 by Mirna Mires, CNM No   Supervision of high risk pregnancy, antepartum 04/06/2020 by Mirna Mires, CNM No   Overview Addendum 11/02/2020 10:19 AM by Mirna Mires, CNM     Nursing Staff Provider  Office Location  [facility] Dating   9w [place]  Language  English Anatomy US   complete, normal  Flu Vaccine   10/21 Genetic Screen  NIPS: neg x3, XX  TDaP vaccine   09/22/20 Hgb A1C or  GTT Third trimester : 118  Rhogam   09/08/20   LAB RESULTS   Feeding Plan  breast  Blood Type O/Negative/-- (12/06 1150)   Contraception  depo Antibody Positive, See Final Results (05/04 1543)  Circumcision  Rubella 9.39 (12/06 1150)  Pediatrician   RPR Non Reactive (05/04 1543)   Support Person  Fiance Casimiro Needle HBsAg Negative (12/06 1150)   Prenatal Classes  Resources provided 09/22/20 HIV Non Reactive (05/04 1543)    Varicella  immune  BTL Consent  n/a GBS  (For PCN allergy, check sensitivities) Negative       VBAC Consent  n/a Pap  04/06/20 NILM    Hgb Electro      CF      SMA        ZOn Lmamictal and Seroquel- growth scans ordered- 73% at 32 weeks.               Review of Systems: 10 point review of systems negative unless otherwise noted in HPI  Past Medical History: Patient Active Problem List   Diagnosis Date Noted   Normal labor 11/24/2020   Irregular uterine contractions 10/28/2020   Generalized anxiety disorder with panic attacks 07/07/2020   Headache in pregnancy, antepartum 05/27/2020    Marijuana user 04/14/2020   Supervision of high risk pregnancy, antepartum 04/06/2020     Nursing Staff Provider  Office Location  [facility] Dating   9w [place]  Language  English Anatomy US   complete, normal  Flu Vaccine   10/21 Genetic Screen  NIPS: neg x3, XX  TDaP vaccine   09/22/20 Hgb A1C or  GTT Third trimester : 118  Rhogam   09/08/20   LAB RESULTS   Feeding Plan  breast  Blood Type O/Negative/-- (12/06 1150)   Contraception  depo Antibody Positive, See Final Results (05/04 1543)  Circumcision  Rubella 9.39 (12/06 1150)  Pediatrician   RPR Non Reactive (05/04 1543)   Support Person  Fiance Casimiro Needle HBsAg Negative (12/06 1150)   Prenatal Classes  Resources provided 09/22/20 HIV Non Reactive (05/04 1543)    Varicella  immune  BTL Consent  n/a GBS  (For PCN allergy, check sensitivities) Negative       VBAC Consent  n/a Pap  04/06/20 NILM    Hgb Electro      CF      SMA        ZOn Lmamictal and Seroquel- growth scans ordered- 73% at 32 weeks.       Abnormal  celiac antibody panel 09/02/2019   Bipolar depression (HCC) 06/13/2017   Anaphylactic reaction 01/19/2014   ADHD (attention deficit hyperactivity disorder) 01/19/2014   Gluten intolerance 08/07/2013    Past Surgical History: Past Surgical History:  Procedure Laterality Date   ADENOIDECTOMY     ESOPHAGOGASTRODUODENOSCOPY N/A 06/14/2013   Procedure: ESOPHAGOGASTRODUODENOSCOPY (EGD);  Surgeon: Jon Gills, MD;  Location: [facility] OR;  Service: Gastroenterology;  Laterality: N/A;   TONSILLECTOMY     WISDOM TOOTH EXTRACTION      Past Obstetric History: # 1 - Date: None, Sex: None, Weight: None, GA: None, Delivery: None, Apgar1: None, Apgar5: None, Living: None, Birth Comments: None   Past Gynecologic History:  Family History: Family History  Problem Relation Age of Onset   Cholelithiasis Maternal Grandmother    Hypertension Maternal Grandmother    Hyperlipidemia Maternal Grandmother    Thyroid disease Maternal  Grandmother    Cholelithiasis Maternal Grandfather    Hypertension Maternal Grandfather    Heart Problems Maternal Grandfather    Hyperlipidemia Mother    Thyroid disease Mother    Celiac disease Neg Hx     Social History: Social History   Socioeconomic History   Marital status: Significant Other    Spouse name: Casimiro Needle   Number of children: Not on file   Years of education: Not on file   Highest education level: Not on file  Occupational History   Not on file  Tobacco Use   Smoking status: Every Day    Packs/day: 0.50    Years: 1.50    Pack years: 0.75    Types: Cigarettes   Smokeless tobacco: Never  Vaping Use   Vaping Use: Former  Substance and Sexual Activity   Alcohol use: No   Drug use: Yes    Types: Marijuana    Comment: stopped 11/21   Sexual activity: Yes    Birth control/protection: None    Comment: planning depo  Other Topics Concern   Not on file  Social History Narrative   ** Merged History Encounter **       9th grade 2014-2015   Right handed   Drinks caffeine   Lives in one story home      Currently [redacted] weeks pregnant   Social Determinants of Health   Financial Resource Strain: Not on file  Food Insecurity: Not on file  Transportation Needs: Not on file  Physical Activity: Not on file  Stress: Not on file  Social Connections: Not on file  Intimate Partner Violence: Not on file    Medications: Prior to Admission medications   Medication Sig Start Date End Date Taking? Authorizing Provider  cyproheptadine (PERIACTIN) 4 MG tablet Take 1 tablet (4 mg total) by mouth 3 (three) times daily. 10/09/20  Yes Everlena Cooper, Adam R, DO  lamoTRIgine (LAMICTAL) 100 MG tablet Take 100 mg by mouth daily. 10/22/20  Yes [provider]  Prenatal Vit-Fe Fumarate-FA (PRENATAL VITAMINS) 28-0.8 MG TABS Take by mouth.   Yes [provider]  QUEtiapine (SEROQUEL) 300 MG tablet Take 1 tablet by mouth daily.   Yes [provider]     Allergies: Allergies  Allergen Reactions   Azithromycin Anaphylaxis, Hives, Other (See Comments) and Itching    Trouble breathing Trouble breathing   Augmentin [Amoxicillin-Pot Clavulanate] Hives   Gluten Meal Nausea And Vomiting    Physical Exam: Vitals: Blood pressure 127/80, pulse (!) 106, temperature 98.4 F (36.9 C), temperature source Oral, resp. rate 18, height 5\' 2"  (1.575 m),  weight 79.8 kg, last menstrual period 01/09/2020.  Urine Dip Protein: N/A  FHT: 145, moderate, +accels, no decels Toco: q18min  General: NAD HEENT: normocephalic, anicteric Pulmonary: No increased work of breathing Cardiovascular: RRR, distal pulses 2+ Abdomen: Gravid, non-tender Leopolds: ctx Genitourinary:  Dilation: 3.5 Effacement (%): 80, 90 Cervical Position: Middle Station: -1 Presentation: Vertex Exam by:: Christean Leaf RN  Extremities: no edema, erythema, or tenderness Neurologic: Grossly intact Psychiatric: mood appropriate, affect full  Labs: No results found for this or any previous visit (from the past 24 hour(s)).  Assessment: 20 y.o. G1P0000 [redacted]w[redacted]d by 11/23/2020, by Ultrasound presenting in term labor  Plan: 1) Labor - admit for term labor expectant management  2) Fetus - cat I tracing  3) PNL - Blood type O/Negative/-- (12/06 1150) / Anti-bodyscreen Positive, See Final Results (05/04 1543) / Rubella 9.39 (12/06 1150) / Varicella Immune / RPR Non Reactive (05/04 1543) / HBsAg Negative (12/06 1150) / HIV Non Reactive (05/04 1543) / 1-hr OGTT 118 / GBS Negative/-- (06/13 1621)  4) Immunization History -  Immunization History  Administered Date(s) Administered   Tdap 09/22/2020    5) Disposition - pending delivery  Vena Austria, MD, [facility], [facility] 11/24/2020, 9:05 AM

## 2020-11-24 NOTE — OB Triage Note (Signed)
Pt presented to L/D triage with reported contractions that began to increase in frequency and intensity at 0530 today. Pt describes pain as intermittent, rated 8/10. Ctx palpate moderate. Pt reports no bleeding or LOF and states positive fetal movement.  Monitors applied and assessing- FHT 150. VSS.

## 2020-11-24 NOTE — Discharge Summary (Signed)
Postpartum Discharge Summary    Patient Name: Mary Crane DOB: 2000/08/01 MRN: [id number]  Date of admission: 11/24/2020 Delivery date:11/24/2020  Delivering provider: Prentice Docker D  Date of discharge: 11/26/2020  Admitting diagnosis: Normal labor [O80, Z37.9] Intrauterine pregnancy: [redacted]w[redacted]d    Secondary diagnosis:  Principal Problem:   Supervision of high risk pregnancy, antepartum Active Problems:   Generalized anxiety disorder with panic attacks   Bipolar depression ([place]   Normal labor   [redacted] weeks gestation of pregnancy   Postpartum care following vaginal delivery  Additional problems: none    Discharge diagnosis: Term Pregnancy Delivered                                              Post partum procedures: none Augmentation:  none Complications: None  Hospital course: Onset of Labor With Vaginal Delivery      20y.o. yo G1P0000 at 461w1das admitted in Active Labor on 11/24/2020. Patient had an uncomplicated labor course as follows:  Membrane Rupture Time/Date: 2:31 PM ,11/24/2020   Delivery Method:Vaginal, Spontaneous  Episiotomy: None  Lacerations:  None  Patient had an uncomplicated postpartum course.  She is ambulating, tolerating a regular diet, passing flatus, and urinating well. Patient is discharged home in stable condition on 11/26/20.  Newborn Data: Birth date:11/24/2020  Birth time:9:18 PM  Gender:Female  Living status:Living  Apgars:8 ,9  Weight:3190 g   Magnesium Sulfate received: No BMZ received: No Rhophylac:No MMR:No T-DaP:Given prenatally on 09/22/2020 Flu: No Transfusion:No  Physical exam  Vitals:   11/25/20 0815 11/25/20 1211 11/25/20 2315 11/26/20 0920  BP:  118/78 118/87 117/76  Pulse: 100 98 96 93  Resp:  18 20   Temp:  98.4 F (36.9 C) 98.4 F (36.9 C) 97.9 F (36.6 C)  TempSrc:  Oral Oral Oral  SpO2:   98% 98%  Weight:      Height:       General: alert, cooperative, and no distress Lochia: appropriate Uterine  Fundus: firm Incision: N/A DVT Evaluation: No evidence of DVT seen on physical exam. Negative Homan's sign. Labs: Lab Results  Component Value Date   WBC 30.5 (H) 11/25/2020   HGB 11.3 (L) 11/25/2020   HCT 33.5 (L) 11/25/2020   MCV 88.4 11/25/2020   PLT 314 11/25/2020   CMP Latest Ref Rng & Units 09/10/2015  Glucose 65 - 99 mg/dL 85  BUN 6 - 20 mg/dL 9  Creatinine 0.50 - 1.00 mg/dL 0.67  Sodium 135 - 145 mmol/L 140  Potassium 3.5 - 5.1 mmol/L 3.4(L)  Chloride 101 - 111 mmol/L 107  CO2 22 - 32 mmol/L 23  Calcium 8.9 - 10.3 mg/dL 9.6  Total Protein 6.5 - 8.1 g/dL 7.5  Total Bilirubin 0.3 - 1.2 mg/dL 0.4  Alkaline Phos 47 - 119 U/L 93  AST 15 - 41 U/L 21  ALT 14 - 54 U/L 29   Edinburgh Score: Edinburgh Postnatal Depression Scale Screening Tool 11/24/2020  I have been able to laugh and see the funny side of things. 0  I have looked forward with enjoyment to things. 0  I have blamed myself unnecessarily when things went wrong. 1  I have been anxious or worried for no good reason. 2  I have felt scared or panicky for no good reason. 2  Things have been getting on top of me. 1  I have been so unhappy that I have had difficulty sleeping. 0  I have felt sad or miserable. 0  I have been so unhappy that I have been crying. 0  The thought of harming myself has occurred to me. 0  Edinburgh Postnatal Depression Scale Total 6      After visit meds:  Allergies as of 11/26/2020       Reactions   Azithromycin Anaphylaxis, Hives, Other (See Comments), Itching   Trouble breathing Trouble breathing   Augmentin [amoxicillin-pot Clavulanate] Hives   Gluten Meal Nausea And Vomiting        Medication List     TAKE these medications    cyproheptadine 4 MG tablet Commonly known as: PERIACTIN Take 1 tablet (4 mg total) by mouth 3 (three) times daily.   ibuprofen 600 MG tablet Commonly known as: ADVIL Take 1 tablet (600 mg total) by mouth every 6 (six) hours.   lamoTRIgine 100  MG tablet Commonly known as: LAMICTAL Take 100 mg by mouth daily.   Prenatal Vitamins 28-0.8 MG Tabs Take by mouth.   QUEtiapine 300 MG tablet Commonly known as: SEROQUEL Take 1 tablet by mouth daily.         Discharge home in stable condition Infant Feeding: Breast Infant Disposition:home with mother Discharge instruction: per After Visit Summary and Postpartum booklet. Activity: Advance as tolerated. Pelvic rest for 6 weeks.  Diet: routine diet Anticipated Birth Control: Depo Postpartum Appointment:2 weeks Additional Postpartum F/U: Postpartum Depression checkup Future Appointments: Future Appointments  Date Time Provider [place]  02/22/2021 10:30 AM Pieter Partridge, DO [facility] None   Follow up Visit:  Follow-up Information     Will Bonnet, MD. Schedule an appointment as soon as possible for a visit in 2 week(s).   Specialty: Obstetrics and Gynecology Why: postpartum mood check Contact information: [address] [place] [address] [phone]                SIGNED: Imagene Riches, CNM  11/26/2020 10:17 AM

## 2020-11-25 ENCOUNTER — Encounter: Payer: Self-pay | Admitting: Obstetrics and Gynecology

## 2020-11-25 LAB — CBC
HCT: 33.5 % — ABNORMAL LOW (ref 36.0–46.0)
Hemoglobin: 11.3 g/dL — ABNORMAL LOW (ref 12.0–15.0)
MCH: 29.8 pg (ref 26.0–34.0)
MCHC: 33.7 g/dL (ref 30.0–36.0)
MCV: 88.4 fL (ref 80.0–100.0)
Platelets: 314 10*3/uL (ref 150–400)
RBC: 3.79 MIL/uL — ABNORMAL LOW (ref 3.87–5.11)
RDW: 14.3 % (ref 11.5–15.5)
WBC: 30.5 10*3/uL — ABNORMAL HIGH (ref 4.0–10.5)
nRBC: 0 % (ref 0.0–0.2)

## 2020-11-25 LAB — URINALYSIS, ROUTINE W REFLEX MICROSCOPIC
Bilirubin Urine: NEGATIVE
Glucose, UA: NEGATIVE mg/dL
Hgb urine dipstick: NEGATIVE
Ketones, ur: 5 mg/dL — AB
Leukocytes,Ua: NEGATIVE
Nitrite: NEGATIVE
Protein, ur: NEGATIVE mg/dL
Specific Gravity, Urine: 1.004 — ABNORMAL LOW (ref 1.005–1.030)
pH: 6 (ref 5.0–8.0)

## 2020-11-25 LAB — RPR: RPR Ser Ql: NONREACTIVE

## 2020-11-25 MED ORDER — SENNOSIDES-DOCUSATE SODIUM 8.6-50 MG PO TABS
2.0000 | ORAL_TABLET | ORAL | Status: DC
  Administered 2020-11-25 – 2020-11-26 (×2): 2 via ORAL
  Filled 2020-11-25 (×2): qty 2

## 2020-11-25 NOTE — Lactation Note (Signed)
This note was copied from a baby's chart. Lactation Consultation Note  Patient Name: Mary Crane UXLKG'M Date: 11/25/2020 Reason for consult: Follow-up assessment;Primapara;Term Age:22 hours  Lactation check-in. Baby has spent some time in SCN due to low temperatures. Baby is back with mom now, resting and swaddled tightly.  LC reviewed with mom hand expression or pumping if baby remains sleepy, and use of EBM can be given later. Discussed cluster feeding overnight, and mom asks about continued bottles as needed d/t medications she takes. LC encourages family effort, and finding rhythm that works best for them. Discussed potential impact for continual use of bottles while establishing breastfeeding, and encouraged mom to wake for as many feedings as she can.  Maternal Data Has patient been taught Hand Expression?: Yes Does the patient have breastfeeding experience prior to this delivery?: No  Feeding Mother's Current Feeding Choice: Breast Milk  LATCH Score                    Lactation Tools Discussed/Used    Interventions Interventions: Education  Discharge Pump: Personal  Consult Status Consult Status: Follow-up Date: 11/26/20 Follow-up type: In-patient    Danford Bad 11/25/2020, 3:41 PM

## 2020-11-25 NOTE — Progress Notes (Signed)
Post Partum Day 1 Subjective: up ad lib, voiding, tolerating PO, and she has had some urinary challenges since delivery , including one I and O cath for retained urine. Able to get up this morning and void on her own. She reports her baby has not yet latched, and Lactation will come and work with her this morning. She admits to having used CBD products recently, but no MJ in "months".  Objective: Blood pressure 111/78, pulse 100, temperature 97.7 F (36.5 C), temperature source Oral, resp. rate 20, height 5\' 2"  (1.575 m), weight 79.8 kg, last menstrual period 01/09/2020, SpO2 99 %, unknown if currently breastfeeding.  Physical Exam:  General: alert, cooperative, and no distress Lochia: appropriate Uterine Fundus: firm Incision: intact perineum, slight edema. DVT Evaluation: No evidence of DVT seen on physical exam. Negative Homan's sign.  Recent Labs    11/24/20 0902 11/25/20 0554  HGB 12.3 11.3*  HCT 36.0 33.5*    Assessment/Plan: Plan for discharge tomorrow, Breastfeeding, Lactation consult, Social Work consult, and Contraception Depo likely.   LOS: 1 day   11/27/20 11/25/2020, 9:55 AM

## 2020-11-25 NOTE — Anesthesia Postprocedure Evaluation (Signed)
Anesthesia Post Note  Patient: Mary Crane  Procedure(s) Performed: AN AD HOC LABOR EPIDURAL  Patient location during evaluation: Mother Baby Anesthesia Type: Epidural Level of consciousness: awake and alert Pain management: pain level controlled Vital Signs Assessment: post-procedure vital signs reviewed and stable Respiratory status: spontaneous breathing, nonlabored ventilation and respiratory function stable Cardiovascular status: stable Postop Assessment: no headache, no backache and epidural receding Anesthetic complications: no   No notable events documented.   Last Vitals:  Vitals:   11/25/20 0043 11/25/20 0311  BP: (!) 99/57 122/79  Pulse: 100 (!) 110  Resp: 18 20  Temp: 36.7 C 36.6 C  SpO2: 97% 97%    Last Pain:  Vitals:   11/25/20 0311  TempSrc: Oral  PainSc:                  Karoline Caldwell

## 2020-11-25 NOTE — Lactation Note (Signed)
This note was copied from a baby's chart. Lactation Consultation Note  Patient Name: Mary Crane EHUDJ'S Date: 11/25/2020 Reason for consult: Initial assessment;Mother's request;Primapara;1st time breastfeeding;Term Age:22 hours  Initial lactation visit. Mom is G1P1 SVD 12 hours ago. Mom has not had baby feed at the breast thus far- RN reports initial difficulty with latching so mom began pumping and giving colostrum via bottle but strongly desires to put baby to the breast.  Baby was sleeping soundly on Dad's chest. LC used this time to speak with parents about feeding preferences, tips/strategies for getting baby to the breast, discussed and taught hand expression, and reviewed position options.  LC removed baby from Dad, and gently woke baby up. Baby began to give light hunger cues, while still calm brought to mom in cross-cradle hold. Multiple attempts to get baby to latch at the breast- with tea cup hold of the breast tissue- baby maintained latch and began strong rhythmic sucking pattern, occasional swallows were identified.   Encouraged continued feeding with early cues. Explained difference in ease of latching with early vs late cues, tips for deep latch, position and alignment, and output expectations moving forward with direct breastfeeding.  Whiteboard updated with [facility] name and number. Encouraged to call throughout the day for feeding support as needed.  Maternal Data Has patient been taught Hand Expression?: Yes Does the patient have breastfeeding experience prior to this delivery?: No  Feeding Mother's Current Feeding Choice: Breast Milk  LATCH Score Latch: Repeated attempts needed to sustain latch, nipple held in mouth throughout feeding, stimulation needed to elicit sucking reflex.  Audible Swallowing: A few with stimulation  Type of Nipple: Everted at rest and after stimulation  Comfort (Breast/Nipple): Soft / non-tender  Hold (Positioning): Assistance  needed to correctly position infant at breast and maintain latch.  LATCH Score: 7   Lactation Tools Discussed/Used Tools: Pump Breast pump type: Double-Electric Breast Pump (personal) Pump Education: Setup, frequency, and cleaning;Milk Storage (mom already aware) Reason for Pumping: baby did not latch after delivery Pumping frequency: q 3 hours  Interventions Interventions: Breast feeding basics reviewed;Assisted with latch;Hand express;Breast massage;Breast compression;Adjust position;Support pillows;Education  Discharge Pump: Personal  Consult Status Consult Status: Follow-up Date: 11/25/20 Follow-up type: In-patient    Danford Bad 11/25/2020, 12:03 PM

## 2020-11-26 ENCOUNTER — Encounter: Payer: Medicaid Other | Admitting: Obstetrics

## 2020-11-26 LAB — URINE CULTURE: Culture: NO GROWTH

## 2020-11-26 MED ORDER — IBUPROFEN 600 MG PO TABS: 600.0000 mg | ORAL_TABLET | Freq: Four times a day (QID) | ORAL | 0 refills | Status: DC

## 2020-11-26 NOTE — Progress Notes (Signed)
Pt discharged with infant.  Discharge instructions, prescriptions and follow up appointment given to and reviewed with pt. Pt verbalized understanding. Escorted out by auxillary. 

## 2020-11-26 NOTE — TOC Initial Note (Signed)
Transition of Care [facility]) - Initial/Assessment Note    Patient Details  Name: Mary Crane MRN: [id number] Date of Birth: 2000-10-20  [facility] CM/SW Contact:    Hetty Ely, RN Phone Number: 11/26/2020, 4:02 PM  Clinical Narrative:  Spoke with patient, father of the baby Donzetta Matters at the bedside holding newborn with a smile. First baby for both of them, voices great support from both sides of the family and friends. Mom states she has everything and more for the newborn. Mom and Casimiro Needle are not married however lives together in the home, maternal mother lives with them due to financial burden when Covid hit. Discussed post partum depression, Mom says she had a long history of anxiety and depression, on two meds, Seroquel and Lamictal both taken at night. Patient states she has a Visual merchandiser on call if she needs them and Casimiro Needle has assess to the phone numbers to call also. Spoke with patient about positive Marijuana in urine drug screen and she says it was in the CBD that she used a week ago and there is no worry she will not use it anymore. Patient informed that the positive urine drug screen will have to be reported to the are health department, and to make sure she answers when they call, Mom voices understanding, [phone].                     Patient Goals and CMS Choice        Expected Discharge Plan and Services           Expected Discharge Date: 11/26/20                                    Prior Living Arrangements/Services                       Activities of Daily Living Home Assistive Devices/Equipment: None ADL Screening (condition at time of admission) Patient's cognitive ability adequate to safely complete daily activities?: Yes Is the patient deaf or have difficulty hearing?: No Does the patient have difficulty seeing, even when wearing glasses/contacts?: No Does the patient have difficulty  concentrating, remembering, or making decisions?: No Patient able to express need for assistance with ADLs?: Yes Does the patient have difficulty dressing or bathing?: No Independently performs ADLs?: Yes (appropriate for developmental age) Does the patient have difficulty walking or climbing stairs?: No Weakness of Legs: None Weakness of Arms/Hands: None  Permission Sought/Granted                  Emotional Assessment              Admission diagnosis:  Normal labor [O80, Z37.9] Patient Active Problem List   Diagnosis Date Noted   Postpartum care following vaginal delivery 11/26/2020   Normal labor 11/24/2020   [redacted] weeks gestation of pregnancy 11/24/2020   Irregular uterine contractions 10/28/2020   Generalized anxiety disorder with panic attacks 07/07/2020   Headache in pregnancy, antepartum 05/27/2020   Marijuana user 04/14/2020   Supervision of high risk pregnancy, antepartum 04/06/2020   Abnormal celiac antibody panel 09/02/2019   Bipolar depression (HCC) 06/13/2017   Anaphylactic reaction 01/19/2014   ADHD (attention deficit hyperactivity disorder) 01/19/2014   Gluten intolerance 08/07/2013   PCP:  Chrys Racer, MD Pharmacy:   [facility] [place], [place] - [phone]  [address] [place] [address] Phone: [phone] Fax: [phone]     Social Determinants of Health (SDOH) Interventions    Readmission Risk Interventions No flowsheet data found.

## 2020-11-26 NOTE — Lactation Note (Signed)
This note was copied from a baby's chart. Lactation Consultation Note  Patient Name: Mary Crane [id number] Date: 11/26/2020 Reason for consult: Follow-up assessment;Term Age:22 hours  Lactation follow-up prior to anticipated discharge. Baby minimal weight loss, bilirubin below concerning levels, and output that exceeds expectations.  Baby has continued to go to the breast without difficulties or mom needing support. Mom is more tender on L breast. Reviewed use of coconut oil, mindfulness of position and latch, and sandwich for deeper latch.  Reviewed signs that baby is getting enough, transitioning her pumping journey to "as needed", and answered questions re: soothing techniques and how to keep baby awake/alert at the breast while feeding. Education given for anticipated growth spurts and cluster feeding, normal course of lactation and milk supply and demand.  Reviewed anticipated breast changes, management of breast fullness and engorgement and nipple care.  Outpatient lactation service information and community breastfeeding support services provided. Encouraged to call with questions/concerns or to make appointment for ongoing BF support.  Maternal Data Has patient been taught Hand Expression?: Yes Does the patient have breastfeeding experience prior to this delivery?: No  Feeding Mother's Current Feeding Choice: Breast Milk  LATCH Score Latch:  (baby on/off, not interested)                  Lactation Tools Discussed/Used Tools: Pump Breast pump type: Double-Electric Breast Pump;Other (comment) (Her own Spectra) Reason for Pumping: Mom's choice; delay in first latch Pumping frequency: q 3hrs  Interventions    Discharge Discharge Education: Engorgement and breast care;Warning signs for feeding baby;Outpatient recommendation Pump: Personal  Consult Status Consult Status: Complete Date: 11/26/20 Follow-up type: Call as needed    Danford Bad 11/26/2020, 10:30 AM

## 2020-12-08 ENCOUNTER — Ambulatory Visit (INDEPENDENT_AMBULATORY_CARE_PROVIDER_SITE_OTHER): Payer: Medicaid Other | Admitting: Obstetrics

## 2020-12-08 ENCOUNTER — Other Ambulatory Visit: Payer: Self-pay

## 2020-12-08 NOTE — Progress Notes (Signed)
Obstetrics & Gynecology Office Visit   Chief Complaint:  Chief Complaint  Patient presents with   Gynecologic Exam   Postpartum Care    History of Present Illness: Mary Crane presents at 2 weeks PP for a mood check. She had a vaginal birth on7/22/2022 with Dr. Jean Rosenthal. Hr prenatal care was notable for her Bipolar disorder and use of lamictal and Seroquel during the pregnancy. She is here with the FOB, and is smiling, reporting that she is doing well. She is providing breastmilk to the baby, largely via bottle due to the baby having latch issues early on. She denies any physical concerns, and her mood is excellent.   Review of Systems:  Review of Systems  Constitutional: Negative.   HENT: Negative.    Eyes: Negative.   Respiratory: Negative.    Cardiovascular: Negative.   Gastrointestinal: Negative.   Genitourinary: Negative.   Musculoskeletal: Negative.   Skin: Negative.   Neurological: Negative.   Endo/Heme/Allergies: Negative.   Psychiatric/Behavioral: Negative.      Past Medical History:  Past Medical History:  Diagnosis Date   Abdominal pain    Abdominal pain    Abnormal celiac antibody panel    Allergy    Anxiety    Bipolar depression (HCC)    Borderline personality disorder (HCC)    Depression    Early satiety    Eczema    bilateral legs   Headache(784.0)    Nausea    Nausea    Panic disorder    Seasonal allergies    Vision abnormalities    Hx: of wears glasses    Past Surgical History:  Past Surgical History:  Procedure Laterality Date   ADENOIDECTOMY     ESOPHAGOGASTRODUODENOSCOPY N/A 06/14/2013   Procedure: ESOPHAGOGASTRODUODENOSCOPY (EGD);  Surgeon: Jon Gills, MD;  Location: [facility] OR;  Service: Gastroenterology;  Laterality: N/A;   TONSILLECTOMY     WISDOM TOOTH EXTRACTION      Gynecologic History: No LMP recorded.  Obstetric History: G1P1001  Family History:  Family History  Problem Relation Age of Onset   Cholelithiasis Maternal  Grandmother    Hypertension Maternal Grandmother    Hyperlipidemia Maternal Grandmother    Thyroid disease Maternal Grandmother    Cholelithiasis Maternal Grandfather    Hypertension Maternal Grandfather    Heart Problems Maternal Grandfather    Hyperlipidemia Mother    Thyroid disease Mother    Celiac disease Neg Hx     Social History:  Social History   Socioeconomic History   Marital status: Significant Other    Spouse name: Casimiro Needle   Number of children: Not on file   Years of education: Not on file   Highest education level: Not on file  Occupational History   Not on file  Tobacco Use   Smoking status: Every Day    Packs/day: 0.50    Years: 1.50    Pack years: 0.75    Types: Cigarettes   Smokeless tobacco: Never  Vaping Use   Vaping Use: Former  Substance and Sexual Activity   Alcohol use: No   Drug use: Yes    Types: Marijuana    Comment: stopped 11/21   Sexual activity: Yes    Birth control/protection: None    Comment: planning depo  Other Topics Concern   Not on file  Social History Narrative   ** Merged History Encounter **       9th grade 2014-2015   Right handed   Drinks caffeine  Lives in one story home      Currently [redacted] weeks pregnant   Social Determinants of Health   Financial Resource Strain: Not on file  Food Insecurity: Not on file  Transportation Needs: Not on file  Physical Activity: Not on file  Stress: Not on file  Social Connections: Not on file  Intimate Partner Violence: Not on file    Allergies:  Allergies  Allergen Reactions   Azithromycin Anaphylaxis, Hives, Other (See Comments) and Itching    Trouble breathing Trouble breathing   Augmentin [Amoxicillin-Pot Clavulanate] Hives   Gluten Meal Nausea And Vomiting    Medications: Prior to Admission medications   Medication Sig Start Date End Date Taking? Authorizing Provider  cyproheptadine (PERIACTIN) 4 MG tablet Take 1 tablet (4 mg total) by mouth 3 (three) times  daily. 10/09/20  Yes Jaffe, Adam R, DO  ibuprofen (ADVIL) 600 MG tablet Take 1 tablet (600 mg total) by mouth every 6 (six) hours. 11/26/20  Yes Mirna Mires, CNM  lamoTRIgine (LAMICTAL) 100 MG tablet Take 100 mg by mouth daily. 10/22/20  Yes [provider]  Prenatal Vit-Fe Fumarate-FA (PRENATAL VITAMINS) 28-0.8 MG TABS Take by mouth.   Yes [provider]  QUEtiapine (SEROQUEL) 300 MG tablet Take 1 tablet by mouth daily.   Yes [provider]    Physical Exam Vitals:  Vitals:   12/08/20 1617  BP: 118/70   No LMP recorded.  Physical Exam Vitals reviewed.  Constitutional:      Appearance: Normal appearance.  HENT:     Head: Normocephalic and atraumatic.  Cardiovascular:     Rate and Rhythm: Normal rate and regular rhythm.     Pulses: Normal pulses.     Heart sounds: Normal heart sounds.  Pulmonary:     Effort: Pulmonary effort is normal.     Breath sounds: Normal breath sounds.  Abdominal:     Palpations: Abdomen is soft.  Genitourinary:    Comments: Xam deferred Musculoskeletal:        General: Normal range of motion.     Cervical back: Normal range of motion and neck supple.  Skin:    General: Skin is warm and dry.  Neurological:     General: No focal deficit present.     Mental Status: She is alert and oriented to person, place, and time.  Psychiatric:        Mood and Affect: Mood normal.        Behavior: Behavior normal.     Comments: Edinburgh Score is 5.     Assessment: 20 y.o. G1P1001 for 2 week postpartum mood check.  Plan: Problem List Items Addressed This Visit       Other   Postpartum care following vaginal delivery - Primary  She will RTC in 4 weeks to see Dr. Jean Rosenthal for her PP physical. Thinking about POPs for birth control.  Mirna Mires, CNM  12/08/2020 6:13 PM

## 2020-12-22 ENCOUNTER — Other Ambulatory Visit: Payer: Medicaid Other

## 2021-01-05 ENCOUNTER — Ambulatory Visit: Payer: Medicaid Other | Admitting: Obstetrics and Gynecology

## 2021-01-08 ENCOUNTER — Ambulatory Visit (INDEPENDENT_AMBULATORY_CARE_PROVIDER_SITE_OTHER): Payer: Medicaid Other | Admitting: Obstetrics and Gynecology

## 2021-01-08 ENCOUNTER — Other Ambulatory Visit: Payer: Self-pay

## 2021-01-08 ENCOUNTER — Encounter: Payer: Self-pay | Admitting: Obstetrics and Gynecology

## 2021-01-08 DIAGNOSIS — Z30011 Encounter for initial prescription of contraceptive pills: Secondary | ICD-10-CM

## 2021-01-08 MED ORDER — NORETHINDRONE 0.35 MG PO TABS: 1.0000 | ORAL_TABLET | Freq: Every day | ORAL | 4 refills | Status: DC

## 2021-01-08 NOTE — Progress Notes (Signed)
Postpartum Visit   Chief Complaint  Patient presents with   Postpartum Care   History of Present Illness: Patient is a 20 y.o. G1P1001 presents for postpartum visit.  Date of delivery: 11/24/2020 Type of delivery: Vaginal delivery - Vacuum or forceps assisted  no Episiotomy No.  Laceration: no  Pregnancy or labor problems:  no Any problems since the delivery:  no  Newborn Details:  SINGLETON :  1. Baby's name: Neveah. Birth weight: 7.1lb Maternal Details:  Breast Feeding:  yes Post partum depression/anxiety noted:  no Edinburgh Post-Partum Depression Score:  8  Date of last PAP: 04/06/2020  normal   Past Medical History:  Diagnosis Date   Abdominal pain    Abdominal pain    Abnormal celiac antibody panel    Allergy    Anxiety    Bipolar depression (HCC)    Borderline personality disorder (HCC)    Depression    Early satiety    Eczema    bilateral legs   Headache(784.0)    Nausea    Nausea    Panic disorder    Seasonal allergies    Vision abnormalities    Hx: of wears glasses    Past Surgical History:  Procedure Laterality Date   ADENOIDECTOMY     ESOPHAGOGASTRODUODENOSCOPY N/A 06/14/2013   Procedure: ESOPHAGOGASTRODUODENOSCOPY (EGD);  Surgeon: Jon Gills, MD;  Location: [facility] OR;  Service: Gastroenterology;  Laterality: N/A;   TONSILLECTOMY     WISDOM TOOTH EXTRACTION      Prior to Admission medications   Medication Sig Start Date End Date Taking? Authorizing Provider  cyproheptadine (PERIACTIN) 4 MG tablet Take 1 tablet (4 mg total) by mouth 3 (three) times daily. 10/09/20   Drema Dallas, DO  ibuprofen (ADVIL) 600 MG tablet Take 1 tablet (600 mg total) by mouth every 6 (six) hours. 11/26/20   Mirna Mires, CNM  lamoTRIgine (LAMICTAL) 100 MG tablet Take 100 mg by mouth daily. 10/22/20   [provider]  Prenatal Vit-Fe Fumarate-FA (PRENATAL VITAMINS) 28-0.8 MG TABS Take by mouth.    [provider]  QUEtiapine (SEROQUEL) 300 MG tablet  Take 1 tablet by mouth daily.    [provider]    Allergies  Allergen Reactions   Azithromycin Anaphylaxis, Hives, Other (See Comments) and Itching    Trouble breathing Trouble breathing   Augmentin [Amoxicillin-Pot Clavulanate] Hives   Gluten Meal Nausea And Vomiting     Social History   Socioeconomic History   Marital status: Significant Other    Spouse name: Casimiro Needle   Number of children: Not on file   Years of education: Not on file   Highest education level: Not on file  Occupational History   Not on file  Tobacco Use   Smoking status: Every Day    Packs/day: 0.50    Years: 1.50    Pack years: 0.75    Types: Cigarettes   Smokeless tobacco: Never  Vaping Use   Vaping Use: Former  Substance and Sexual Activity   Alcohol use: No   Drug use: Yes    Types: Marijuana    Comment: stopped 11/21   Sexual activity: Yes    Birth control/protection: None    Comment: planning depo  Other Topics Concern   Not on file  Social History Narrative   ** Merged History Encounter **       9th grade 2014-2015   Right handed   Drinks caffeine   Lives in one story home  Currently [redacted] weeks pregnant   Social Determinants of Health   Financial Resource Strain: Not on file  Food Insecurity: Not on file  Transportation Needs: Not on file  Physical Activity: Not on file  Stress: Not on file  Social Connections: Not on file  Intimate Partner Violence: Not on file    Family History  Problem Relation Age of Onset   Cholelithiasis Maternal Grandmother    Hypertension Maternal Grandmother    Hyperlipidemia Maternal Grandmother    Thyroid disease Maternal Grandmother    Cholelithiasis Maternal Grandfather    Hypertension Maternal Grandfather    Heart Problems Maternal Grandfather    Hyperlipidemia Mother    Thyroid disease Mother    Celiac disease Neg Hx     Review of Systems  Constitutional: Negative.   HENT: Negative.    Eyes: Negative.   Respiratory:  Negative.    Cardiovascular: Negative.   Gastrointestinal: Negative.   Genitourinary: Negative.   Musculoskeletal: Negative.   Skin: Negative.   Neurological: Negative.   Psychiatric/Behavioral: Negative.      Physical Exam BP 122/74   Ht 5\' 2"  (1.575 m)   Wt 154 lb (69.9 kg)   Breastfeeding Yes   BMI 28.17 kg/m   Physical Exam Constitutional:      General: She is not in acute distress.    Appearance: Normal appearance.  Genitourinary:     Bladder and urethral meatus normal.     No lesions in the vagina.     Right Labia: No rash, tenderness, lesions or skin changes.    Left Labia: No tenderness, lesions, skin changes or rash.    No inguinal adenopathy present in the right or left side.    Pelvic Tanner Score: 5/5.    No vaginal discharge, erythema, bleeding or ulceration.     No vaginal prolapse present.     Right Adnexa: not tender, not full and no mass present.    Left Adnexa: not tender, not full and no mass present.    No cervical motion tenderness, discharge, friability, lesion or polyp.     Uterus is not enlarged, fixed or tender.     Uterus is anteverted.  HENT:     Head: Normocephalic and atraumatic.  Eyes:     General: No scleral icterus.    Conjunctiva/sclera: Conjunctivae normal.  Lymphadenopathy:     Lower Body: No right inguinal adenopathy. No left inguinal adenopathy.  Neurological:     General: No focal deficit present.     Mental Status: She is alert and oriented to person, place, and time.     Cranial Nerves: No cranial nerve deficit.  Psychiatric:        Mood and Affect: Mood normal.        Behavior: Behavior normal.        Judgment: Judgment normal.     Female Chaperone present during breast and/or pelvic exam.  Assessment: 20 y.o. G1P1001 presenting for 6 week postpartum visit  Plan: Problem List Items Addressed This Visit   None Visit Diagnoses     Postpartum care and examination    -  Primary   Relevant Medications   norethindrone  (MICRONOR) 0.35 MG tablet   Encounter for initial prescription of contraceptive pills       Relevant Medications   norethindrone (MICRONOR) 0.35 MG tablet      1) Contraception: progesterone only pills  2)  Pap - ASCCP guidelines and rational discussed.  Patient opts for routine screening interval  3) Patient underwent screening for postpartum depression with no concerns noted.  4) Follow up 1 year for routine annual exam  Thomasene Mohair, MD 01/08/2021 3:28 PM

## 2021-02-18 NOTE — Progress Notes (Signed)
NEUROLOGY FOLLOW UP OFFICE NOTE  Mary Crane [id number]  Assessment/Plan:   Migraine without aura, without status migrainosus, not intractable Vertigo  Migraine prevention:  start propranolol ER 60mg  daily.  Caution for lightheadedness/dizziness.  We can increase to 80mg  daily in 6 weeks if needed and tolerating Migraine rescue:  sumatriptan 100mg  - advised to hold breastfeeding/pumping for 8 to 12 hours after last dose. Limit use of pain relievers to no more than 2 days out of week to prevent risk of rebound or medication-overuse headache. Keep headache diary Follow up 6 months.   Subjective:  Mary Crane is a 20 year old right-handed female who follows up for migraines.   UPDATE: Started cyproheptadine in May.  She had a normal labor and delivery on 11/24/2020 without complication.  Since having her baby girl, migraines are not as frequent but still severe.  They last all day .  They occur 2 days a week, sometimes 1 every 2-3 weeks (6-7 a month on average).  Takes Tylenol a couple of times a week.  She also may have intermittent episodes of vertigo 5-10 minutes, sometimes with migraines.  That occurs a couple of times a week.  She is breastfeeding.    Current analgesic:  Tylenol Current antiepileptic:  Lamotrigine Current antihistamine:  none Current vitamins/supplements:  magnesium oxide 400mg , riboflavin 400mg  daily  HISTORY: History of headaches described as mild-moderate diffuse pounding headache with photophobia.  No nausea or visual symptoms.  They abort quickly with ibuprofen and would occur 1 to 2 a month.   Since pregnancy her headaches are more severe, diffuse pounding, eyes hurt, with photophobia but still no nausea, vomiting, visual disturbance, numbness or weakness.  They started mid-January, a couple of times a week, then February 4-5 times a week. In March she had 4-5 a month.  In April they have been daily.  They are a diffuse pounding  headache.  Triggers/aggravating factors include coughing or taking a nap.  She takes Tylenol 3 to 5 days a week.  Fioricet ineffective.  She smokes 1/2 ppd or less.      Past analgesic:  Fioricet, ibuprofen, cyproheptadine  PAST MEDICAL HISTORY: Past Medical History:  Diagnosis Date   Abdominal pain    Abdominal pain    Abnormal celiac antibody panel    Allergy    Anxiety    Bipolar depression (HCC)    Borderline personality disorder (HCC)    Depression    Early satiety    Eczema    bilateral legs   Headache(784.0)    Nausea    Nausea    Panic disorder    Seasonal allergies    Vision abnormalities    Hx: of wears glasses    MEDICATIONS: Current Outpatient Medications on File Prior to Visit  Medication Sig Dispense Refill   cyproheptadine (PERIACTIN) 4 MG tablet Take 1 tablet (4 mg total) by mouth 3 (three) times daily. (Patient not taking: Reported on 01/08/2021) 90 tablet 1   ibuprofen (ADVIL) 600 MG tablet Take 1 tablet (600 mg total) by mouth every 6 (six) hours. (Patient not taking: Reported on 01/08/2021) 30 tablet 0   lamoTRIgine (LAMICTAL) 100 MG tablet Take 100 mg by mouth daily.     norethindrone (MICRONOR) 0.35 MG tablet Take 1 tablet (0.35 mg total) by mouth daily. 84 tablet 4   Prenatal Vit-Fe Fumarate-FA (PRENATAL VITAMINS) 28-0.8 MG TABS Take by mouth.     QUEtiapine (SEROQUEL) 300 MG tablet Take 1 tablet  by mouth daily. (Patient not taking: Reported on 01/08/2021)     No current facility-administered medications on file prior to visit.    ALLERGIES: Allergies  Allergen Reactions   Azithromycin Anaphylaxis, Hives, Other (See Comments) and Itching    Trouble breathing Trouble breathing   Augmentin [Amoxicillin-Pot Clavulanate] Hives   Gluten Meal Nausea And Vomiting    FAMILY HISTORY: Family History  Problem Relation Age of Onset   Cholelithiasis Maternal Grandmother    Hypertension Maternal Grandmother    Hyperlipidemia Maternal Grandmother     Thyroid disease Maternal Grandmother    Cholelithiasis Maternal Grandfather    Hypertension Maternal Grandfather    Heart Problems Maternal Grandfather    Hyperlipidemia Mother    Thyroid disease Mother    Celiac disease Neg Hx       Objective:  Blood pressure 107/71, pulse (!) 101, height 5\' 2"  (1.575 m), weight 154 lb (69.9 kg), SpO2 98 %, currently breastfeeding. General: No acute distress.  Patient appears well-groomed.      , DO  CC: Shon Millet, MD

## 2021-02-22 ENCOUNTER — Encounter: Payer: Self-pay | Admitting: Neurology

## 2021-02-22 ENCOUNTER — Ambulatory Visit: Payer: Medicaid Other | Admitting: Neurology

## 2021-02-22 ENCOUNTER — Other Ambulatory Visit: Payer: Self-pay

## 2021-02-22 VITALS — BP 107/71 | HR 101 | Ht 62.0 in | Wt 154.0 lb

## 2021-02-22 DIAGNOSIS — R42 Dizziness and giddiness: Secondary | ICD-10-CM | POA: Diagnosis not present

## 2021-02-22 DIAGNOSIS — G43009 Migraine without aura, not intractable, without status migrainosus: Secondary | ICD-10-CM | POA: Diagnosis not present

## 2021-02-22 MED ORDER — PROPRANOLOL HCL ER 60 MG PO CP24: 60.0000 mg | ORAL_CAPSULE | Freq: Every day | ORAL | 5 refills | Status: DC

## 2021-02-22 MED ORDER — SUMATRIPTAN SUCCINATE 100 MG PO TABS: ORAL_TABLET | ORAL | 5 refills | Status: DC

## 2021-02-22 NOTE — Patient Instructions (Signed)
Start propranolol ER 60mg  daily.  If no improvement in headache frequency in 6 weeks, contact me and we can increase dose.  If you feel lightheaded, contact me and we can change to a different medication When you get a migraine, take sumatriptan 100mg .  May repeat in 2 hours (maximum 2 tablets in 24 hours).  If taken, do not breastfeed or pump for 8 to 12 hours from last dose. Limit use of pain relievers to no more than 2 days out of week to prevent risk of rebound or medication-overuse headache. Keep headache diary Follow up 6 months.

## 2021-03-01 ENCOUNTER — Other Ambulatory Visit: Payer: Self-pay

## 2021-03-01 ENCOUNTER — Encounter: Payer: Self-pay | Admitting: Obstetrics and Gynecology

## 2021-03-01 ENCOUNTER — Ambulatory Visit: Payer: Medicaid Other | Admitting: Obstetrics and Gynecology

## 2021-03-01 VITALS — BP 122/74 | Wt 154.0 lb

## 2021-03-01 DIAGNOSIS — Z30013 Encounter for initial prescription of injectable contraceptive: Secondary | ICD-10-CM

## 2021-03-01 MED ORDER — MEDROXYPROGESTERONE ACETATE 150 MG/ML IM SUSP
150.0000 mg | INTRAMUSCULAR | 4 refills | Status: DC
Start: 2021-03-01 — End: 2021-10-05

## 2021-03-01 NOTE — Progress Notes (Signed)
Obstetrics & Gynecology Office Visit   Chief Complaint  Patient presents with   Contraception    Pt wants to switch birth control.     History of Present Illness: Patient is a 20 y.o. G1P1001 presenting for contraception consult.  She is currently on oral progesterone-only contraceptive and desiring to start Depo-Provera injections.  She has a past medical history significant for migraine with aura, chronic hypertension, history of DVT/PE, smoking, and no contraindication to estrogen.  She specifically denies a history of migraine with aura, chronic hypertension, history of DVT/PE, and smoking.  Reported No LMP recorded..  She does take Lamictal and is concerned that the lamactial may lower the effectiveness of her norethindrone.    Past Medical History:  Diagnosis Date   Abdominal pain    Abdominal pain    Abnormal celiac antibody panel    Allergy    Anxiety    Bipolar depression (HCC)    Borderline personality disorder (HCC)    Depression    Early satiety    Eczema    bilateral legs   Headache(784.0)    Nausea    Nausea    Panic disorder    Seasonal allergies    Vision abnormalities    Hx: of wears glasses    Past Surgical History:  Procedure Laterality Date   ADENOIDECTOMY     ESOPHAGOGASTRODUODENOSCOPY N/A 06/14/2013   Procedure: ESOPHAGOGASTRODUODENOSCOPY (EGD);  Surgeon: Jon Gills, MD;  Location: [facility] OR;  Service: Gastroenterology;  Laterality: N/A;   TONSILLECTOMY     WISDOM TOOTH EXTRACTION      Gynecologic History: No LMP recorded.  Obstetric History: G1P1001  Family History  Problem Relation Age of Onset   Cholelithiasis Maternal Grandmother    Hypertension Maternal Grandmother    Hyperlipidemia Maternal Grandmother    Thyroid disease Maternal Grandmother    Cholelithiasis Maternal Grandfather    Hypertension Maternal Grandfather    Heart Problems Maternal Grandfather    Hyperlipidemia Mother    Thyroid disease Mother    Celiac disease Neg Hx      Social History   Socioeconomic History   Marital status: Significant Other    Spouse name: Casimiro Needle   Number of children: Not on file   Years of education: Not on file   Highest education level: Not on file  Occupational History   Not on file  Tobacco Use   Smoking status: Every Day    Packs/day: 0.50    Years: 1.50    Pack years: 0.75    Types: Cigarettes   Smokeless tobacco: Never  Vaping Use   Vaping Use: Former  Substance and Sexual Activity   Alcohol use: No   Drug use: Yes    Types: Marijuana    Comment: stopped 11/21   Sexual activity: Yes    Birth control/protection: None    Comment: planning depo  Other Topics Concern   Not on file  Social History Narrative   ** Merged History Encounter **       9th grade 2014-2015   Right handed   Drinks caffeine   Lives in one story home      Currently [redacted] weeks pregnant   Social Determinants of Health   Financial Resource Strain: Not on file  Food Insecurity: Not on file  Transportation Needs: Not on file  Physical Activity: Not on file  Stress: Not on file  Social Connections: Not on file  Intimate Partner Violence: Not on file    Allergies  Allergen Reactions   Azithromycin Anaphylaxis, Hives, Other (See Comments) and Itching    Trouble breathing Trouble breathing   Augmentin [Amoxicillin-Pot Clavulanate] Hives   Gluten Meal Nausea And Vomiting    Prior to Admission medications   Medication Sig Start Date End Date Taking? Authorizing Provider  cyproheptadine (PERIACTIN) 4 MG tablet Take 1 tablet (4 mg total) by mouth 3 (three) times daily. Patient not taking: No sig reported 10/09/20   Drema Dallas, DO  ibuprofen (ADVIL) 600 MG tablet Take 1 tablet (600 mg total) by mouth every 6 (six) hours. Patient not taking: No sig reported 11/26/20   Mirna Mires, CNM  lamoTRIgine (LAMICTAL) 100 MG tablet Take 100 mg by mouth daily. 10/22/20   [provider]  norethindrone (MICRONOR) 0.35 MG tablet  Take 1 tablet (0.35 mg total) by mouth daily. 01/08/21   Conard Novak, MD  Prenatal Vit-Fe Fumarate-FA (PRENATAL VITAMINS) 28-0.8 MG TABS Take by mouth.    [provider]  propranolol ER (INDERAL LA) 60 MG 24 hr capsule Take 1 capsule (60 mg total) by mouth daily. 02/22/21   Everlena Cooper, Adam R, DO  QUEtiapine (SEROQUEL) 300 MG tablet Take 1 tablet by mouth daily. Patient not taking: No sig reported    [provider]  SUMAtriptan (IMITREX) 100 MG tablet Take 1 tablet earliest onset of migraine.  May repeat in 2 hours if headache persists or recurs.  Maximum 2 tablets in 24 hours. 02/22/21   Drema Dallas, DO    Review of Systems  Constitutional: Negative.   HENT: Negative.    Eyes: Negative.   Respiratory: Negative.    Cardiovascular: Negative.   Gastrointestinal: Negative.   Genitourinary: Negative.   Musculoskeletal: Negative.   Skin: Negative.   Neurological: Negative.   Psychiatric/Behavioral: Negative.      Physical Exam BP 122/74   Wt 154 lb (69.9 kg)   Breastfeeding Yes   BMI 28.17 kg/m  No LMP recorded. Physical Exam Constitutional:      General: She is not in acute distress.    Appearance: Normal appearance.  HENT:     Head: Normocephalic and atraumatic.  Eyes:     General: No scleral icterus.    Conjunctiva/sclera: Conjunctivae normal.  Neurological:     General: No focal deficit present.     Mental Status: She is alert and oriented to person, place, and time.     Cranial Nerves: No cranial nerve deficit.  Psychiatric:        Mood and Affect: Mood normal.        Behavior: Behavior normal.        Judgment: Judgment normal.    Female chaperone present for pelvic and breast  portions of the physical exam  Assessment: 20 y.o. G24P1001 female here for  1. Encounter for initial prescription of injectable contraceptive      Plan: Problem List Items Addressed This Visit   None Visit Diagnoses     Encounter for initial prescription of  injectable contraceptive    -  Primary   Relevant Medications   medroxyPROGESTERone (DEPO-PROVERA) 150 MG/ML injection      Will switch her to Depo Provera since there is some evidence to suggest there may be a decrease in efficacy of the mini-pill with concomitant use of Lamictal.   Thomasene Mohair, MD 03/01/2021 3:32 PM

## 2021-03-02 ENCOUNTER — Ambulatory Visit (INDEPENDENT_AMBULATORY_CARE_PROVIDER_SITE_OTHER): Payer: Medicaid Other

## 2021-03-02 DIAGNOSIS — Z30013 Encounter for initial prescription of injectable contraceptive: Secondary | ICD-10-CM | POA: Diagnosis not present

## 2021-03-02 LAB — POCT URINE PREGNANCY: Preg Test, Ur: NEGATIVE

## 2021-03-02 MED ORDER — MEDROXYPROGESTERONE ACETATE 150 MG/ML IM SUSP
150.0000 mg | Freq: Once | INTRAMUSCULAR | Status: AC
  Administered 2021-03-02: 150 mg via INTRAMUSCULAR

## 2021-03-02 NOTE — Progress Notes (Signed)
Patient presents today for Depo Provera injection outside of date range. Patient not currently on menses. Pregnancy test performed per protocol (negative).Given IM RT UOQ. Patient tolerated well. NDC# 97530-051-10 Lot# [id number] Exp 03/16/2023

## 2021-04-14 ENCOUNTER — Ambulatory Visit: Payer: Medicaid Other | Admitting: Obstetrics and Gynecology

## 2021-04-14 NOTE — Progress Notes (Deleted)
Inc, [facility]   No chief complaint on file.   HPI:      Ms. ALLIYAH ROESLER is a 20 y.o. G1P1001 whose LMP was No LMP recorded., presents today for ***    Patient Active Problem List   Diagnosis Date Noted   Postpartum care following vaginal delivery 11/26/2020   Normal labor 11/24/2020   [redacted] weeks gestation of pregnancy 11/24/2020   Irregular uterine contractions 10/28/2020   Generalized anxiety disorder with panic attacks 07/07/2020   Headache in pregnancy, antepartum 05/27/2020   Marijuana user 04/14/2020   Supervision of high risk pregnancy, antepartum 04/06/2020   Abnormal celiac antibody panel 09/02/2019   Bipolar depression (HCC) 06/13/2017   Anaphylactic reaction 01/19/2014   ADHD (attention deficit hyperactivity disorder) 01/19/2014   Gluten intolerance 08/07/2013    Past Surgical History:  Procedure Laterality Date   ADENOIDECTOMY     ESOPHAGOGASTRODUODENOSCOPY N/A 06/14/2013   Procedure: ESOPHAGOGASTRODUODENOSCOPY (EGD);  Surgeon: Jon Gills, MD;  Location: [facility] OR;  Service: Gastroenterology;  Laterality: N/A;   TONSILLECTOMY     WISDOM TOOTH EXTRACTION      Family History  Problem Relation Age of Onset   Cholelithiasis Maternal Grandmother    Hypertension Maternal Grandmother    Hyperlipidemia Maternal Grandmother    Thyroid disease Maternal Grandmother    Cholelithiasis Maternal Grandfather    Hypertension Maternal Grandfather    Heart Problems Maternal Grandfather    Hyperlipidemia Mother    Thyroid disease Mother    Celiac disease Neg Hx     Social History   Socioeconomic History   Marital status: Significant Other    Spouse name: Casimiro Needle   Number of children: Not on file   Years of education: Not on file   Highest education level: Not on file  Occupational History   Not on file  Tobacco Use   Smoking status: Every Day    Packs/day: 0.50    Years: 1.50    Pack years: 0.75    Types: Cigarettes    Smokeless tobacco: Never  Vaping Use   Vaping Use: Former  Substance and Sexual Activity   Alcohol use: No   Drug use: Yes    Types: Marijuana    Comment: stopped 11/21   Sexual activity: Yes    Birth control/protection: None    Comment: planning depo  Other Topics Concern   Not on file  Social History Narrative   ** Merged History Encounter **       9th grade 2014-2015   Right handed   Drinks caffeine   Lives in one story home      Currently [redacted] weeks pregnant   Social Determinants of Health   Financial Resource Strain: Not on file  Food Insecurity: Not on file  Transportation Needs: Not on file  Physical Activity: Not on file  Stress: Not on file  Social Connections: Not on file  Intimate Partner Violence: Not on file    Outpatient Medications Prior to Visit  Medication Sig Dispense Refill   cyproheptadine (PERIACTIN) 4 MG tablet Take 1 tablet (4 mg total) by mouth 3 (three) times daily. (Patient not taking: No sig reported) 90 tablet 1   ibuprofen (ADVIL) 600 MG tablet Take 1 tablet (600 mg total) by mouth every 6 (six) hours. (Patient not taking: No sig reported) 30 tablet 0   lamoTRIgine (LAMICTAL) 100 MG tablet Take 100 mg by mouth daily.     medroxyPROGESTERone (DEPO-PROVERA) 150  MG/ML injection Inject 1 mL (150 mg total) into the muscle every 3 (three) months. 1 mL 4   Prenatal Vit-Fe Fumarate-FA (PRENATAL VITAMINS) 28-0.8 MG TABS Take by mouth.     propranolol ER (INDERAL LA) 60 MG 24 hr capsule Take 1 capsule (60 mg total) by mouth daily. 30 capsule 5   QUEtiapine (SEROQUEL) 300 MG tablet Take 1 tablet by mouth daily. (Patient not taking: No sig reported)     SUMAtriptan (IMITREX) 100 MG tablet Take 1 tablet earliest onset of migraine.  May repeat in 2 hours if headache persists or recurs.  Maximum 2 tablets in 24 hours. 10 tablet 5   No facility-administered medications prior to visit.      ROS:  Review of Systems BREAST: No symptoms   OBJECTIVE:    Vitals:  There were no vitals taken for this visit.  Physical Exam  Results: No results found for this or any previous visit (from the past 24 hour(s)).   Assessment/Plan: No diagnosis found.    No orders of the defined types were placed in this encounter.     No follow-ups on file.  Jaquayla Hege B. Burrel Legrand, PA-C 04/14/2021 11:41 AM

## 2021-05-25 ENCOUNTER — Ambulatory Visit: Payer: Medicaid Other

## 2021-09-08 ENCOUNTER — Ambulatory Visit: Payer: Medicaid Other | Admitting: Neurology

## 2021-09-08 NOTE — Progress Notes (Signed)
? ?NEUROLOGY FOLLOW UP OFFICE NOTE ? ?Mary Crane ?[id number] ? ?Assessment/Plan:  ? ?Migraine without aura, without status migrainosus, not intractable ?Vertigo ?  ?Migraine prevention:  increase propranolol ER to 80mg  daily ?Migraine rescue: Tylenol if needed. ?Limit use of pain relievers to no more than 2 days out of week to prevent risk of rebound or medication-overuse headache. ?Keep headache diary ?Follow up 6 months. ?  ?  ?Subjective:  ?Mary Crane is a 21 year old right-handed female who follows up for migraines.  Accompanied by her husband ?  ?UPDATE: ?She is breastfeeding.  Unable to use sumatriptan because her baby will not take a bottle.   ?Started propranolol in October.   ?Intensity:  severe ?Duration:  all day ?Frequency:  6 in 30 days ?  ?Current analgesic:  Tylenol ?Current antihypertensive:  propranolol ER 60mg  daily ?Current antiepileptic:  Lamotrigine ?Current antihistamine:  none ?Current vitamins/supplements:  magnesium oxide 400mg , riboflavin 400mg  daily ?  ?HISTORY: ?History of headaches described as mild-moderate diffuse pounding headache with photophobia.  No nausea or visual symptoms.  They abort quickly with ibuprofen and would occur 1 to 2 a month. ?  ?Since pregnancy her headaches are more severe, diffuse pounding, eyes hurt, with photophobia but still no nausea, vomiting, visual disturbance, numbness or weakness.  They started mid-January, a couple of times a week, then February 4-5 times a week. In March she had 4-5 a month.  In April they have been daily.  They are a diffuse pounding headache.  Triggers/aggravating factors include coughing or taking a nap.  She takes Tylenol 3 to 5 days a week.  Fioricet ineffective.  She smokes 1/2 ppd or less. ?  ?  ?  ?Past analgesic:  Fioricet, ibuprofen, cyproheptadine ? ?PAST MEDICAL HISTORY: ?Past Medical History:  ?Diagnosis Date  ? Abdominal pain   ? Abdominal pain   ? Abnormal celiac antibody panel   ? Allergy    ? Anxiety   ? Bipolar depression (HCC)   ? Borderline personality disorder (HCC)   ? Depression   ? Early satiety   ? Eczema   ? bilateral legs  ? Headache(784.0)   ? Nausea   ? Nausea   ? Panic disorder   ? Seasonal allergies   ? Vision abnormalities   ? Hx: of wears glasses  ? ? ?MEDICATIONS: ?Current Outpatient Medications on File Prior to Visit  ?Medication Sig Dispense Refill  ? cyproheptadine (PERIACTIN) 4 MG tablet Take 1 tablet (4 mg total) by mouth 3 (three) times daily. (Patient not taking: No sig reported) 90 tablet 1  ? ibuprofen (ADVIL) 600 MG tablet Take 1 tablet (600 mg total) by mouth every 6 (six) hours. (Patient not taking: No sig reported) 30 tablet 0  ? lamoTRIgine (LAMICTAL) 100 MG tablet Take 100 mg by mouth daily.    ? medroxyPROGESTERone (DEPO-PROVERA) 150 MG/ML injection Inject 1 mL (150 mg total) into the muscle every 3 (three) months. 1 mL 4  ? Prenatal Vit-Fe Fumarate-FA (PRENATAL VITAMINS) 28-0.8 MG TABS Take by mouth.    ? propranolol ER (INDERAL LA) 60 MG 24 hr capsule Take 1 capsule (60 mg total) by mouth daily. 30 capsule 5  ? QUEtiapine (SEROQUEL) 300 MG tablet Take 1 tablet by mouth daily. (Patient not taking: No sig reported)    ? SUMAtriptan (IMITREX) 100 MG tablet Take 1 tablet earliest onset of migraine.  May repeat in 2 hours if headache persists or recurs.  Maximum 2  tablets in 24 hours. 10 tablet 5  ? ?No current facility-administered medications on file prior to visit.  ? ? ?ALLERGIES: ?Allergies  ?Allergen Reactions  ? Azithromycin Anaphylaxis, Hives, Other (See Comments) and Itching  ?  Trouble breathing ?Trouble breathing  ? Augmentin [Amoxicillin-Pot Clavulanate] Hives  ? Gluten Meal Nausea And Vomiting  ? ? ?FAMILY HISTORY: ?Family History  ?Problem Relation Age of Onset  ? Cholelithiasis Maternal Grandmother   ? Hypertension Maternal Grandmother   ? Hyperlipidemia Maternal Grandmother   ? Thyroid disease Maternal Grandmother   ? Cholelithiasis Maternal Grandfather    ? Hypertension Maternal Grandfather   ? Heart Problems Maternal Grandfather   ? Hyperlipidemia Mother   ? Thyroid disease Mother   ? Celiac disease Neg Hx   ? ? ?  ?Objective:  ?Blood pressure 123/81, pulse 86, height 5\' 2"  (1.575 m), weight 163 lb 6.4 oz (74.1 kg), SpO2 97 %, currently breastfeeding. ?General: No acute distress.  Patient appears well-groomed.   ?Head:  Normocephalic/atraumatic ?Eyes:  Fundi examined but not visualized ?Neck: supple, no paraspinal tenderness, full range of motion ?Heart:  Regular rate and rhythm ?Neurological Exam: alert and oriented to person, place, and time.  Speech fluent and not dysarthric, language intact.  CN II-XII intact. Bulk and tone normal, muscle strength 5/5 throughout.  Sensation to light touch intact.  Deep tendon reflexes 2+ throughout, toes downgoing.  Finger to nose testing intact.  Gait normal, Romberg negative. ? ? ? , DO ? ? ? ? ? ? ? ?

## 2021-09-10 ENCOUNTER — Encounter: Payer: Self-pay | Admitting: Neurology

## 2021-09-10 ENCOUNTER — Ambulatory Visit: Payer: Medicaid Other | Admitting: Neurology

## 2021-09-10 VITALS — BP 123/81 | HR 86 | Ht 62.0 in | Wt 163.4 lb

## 2021-09-10 DIAGNOSIS — G43009 Migraine without aura, not intractable, without status migrainosus: Secondary | ICD-10-CM | POA: Diagnosis not present

## 2021-09-10 MED ORDER — PROPRANOLOL HCL ER 80 MG PO CP24: 80.0000 mg | ORAL_CAPSULE | Freq: Every day | ORAL | 5 refills | Status: DC

## 2021-09-10 NOTE — Patient Instructions (Signed)
?  Increase propranolol to 80mg  daily.   ?Limit use of pain relievers to no more than 2 days out of the week.  These medications include acetaminophen, NSAIDs (ibuprofen/Advil/Motrin, naproxen/Aleve, triptans (Imitrex/sumatriptan), Excedrin, and narcotics.  This will help reduce risk of rebound headaches. ?Be aware of common food triggers: ? - Caffeine:  coffee, black tea, cola, Mt. Dew ? - Chocolate ? - Dairy:  aged cheeses (brie, blue, cheddar, gouda, [place], provolone, [place], Swiss, etc), chocolate milk, buttermilk, sour cream, limit eggs and yogurt ? - Nuts, peanut butter ? - Alcohol ? - Cereals/grains:  FRESH breads (fresh bagels, sourdough, doughnuts), yeast productions ? - Processed/canned/aged/cured meats (pre-packaged deli meats, hotdogs) ? - MSG/glutamate:  soy sauce, flavor enhancer, pickled/preserved/marinated foods ? - Sweeteners:  aspartame (Equal, Nutrasweet).  Sugar and Splenda are okay ? - Vegetables:  legumes (lima beans, lentils, snow peas, fava beans, pinto peans, peas, garbanzo beans), sauerkraut, onions, olives, pickles ? - Fruit:  avocados, bananas, citrus fruit (orange, lemon, grapefruit), mango ? - Other:  Frozen meals, macaroni and cheese ?Routine exercise ?Stay adequately hydrated (aim for 64 oz water daily) ?Keep headache diary ?Maintain proper stress management ?Maintain proper sleep hygiene ?Do not skip meals ?Consider supplements:  magnesium citrate 400mg  daily, riboflavin 400mg  daily, coenzyme Q10 100mg  three times daily. ? ?

## 2021-09-28 ENCOUNTER — Ambulatory Visit: Payer: Medicaid Other | Admitting: Obstetrics and Gynecology

## 2021-10-04 NOTE — Progress Notes (Unsigned)
Inc, [facility]   No chief complaint on file.   HPI:      [name] is a 21 y.o. G1P1001 whose LMP was No LMP recorded., presents today for ***  Depo started 10/22 PP Neg pap 11/21; neg gon/chlam 7/22  Patient Active Problem List   Diagnosis Date Noted   Postpartum care following vaginal delivery 11/26/2020   Normal labor 11/24/2020   [redacted] weeks gestation of pregnancy 11/24/2020   Irregular uterine contractions 10/28/2020   Generalized anxiety disorder with panic attacks 07/07/2020   Headache in pregnancy, antepartum 05/27/2020   Marijuana user 04/14/2020   Supervision of high risk pregnancy, antepartum 04/06/2020   Abnormal celiac antibody panel 09/02/2019   Bipolar depression (HCC) 06/13/2017   Anaphylactic reaction 01/19/2014   ADHD (attention deficit hyperactivity disorder) 01/19/2014   Gluten intolerance 08/07/2013    Past Surgical History:  Procedure Laterality Date   ADENOIDECTOMY     ESOPHAGOGASTRODUODENOSCOPY N/A 06/14/2013   Procedure: ESOPHAGOGASTRODUODENOSCOPY (EGD);  Surgeon: Jon Gills, MD;  Location: [facility] OR;  Service: Gastroenterology;  Laterality: N/A;   TONSILLECTOMY     WISDOM TOOTH EXTRACTION      Family History  Problem Relation Age of Onset   Cholelithiasis Maternal Grandmother    Hypertension Maternal Grandmother    Hyperlipidemia Maternal Grandmother    Thyroid disease Maternal Grandmother    Cholelithiasis Maternal Grandfather    Hypertension Maternal Grandfather    Heart Problems Maternal Grandfather    Hyperlipidemia Mother    Thyroid disease Mother    Celiac disease Neg Hx     Social History   Socioeconomic History   Marital status: Significant Other    Spouse name: Casimiro Needle   Number of children: Not on file   Years of education: Not on file   Highest education level: Not on file  Occupational History   Not on file  Tobacco Use   Smoking status: Every Day    Packs/day: 0.50     Years: 1.50    Pack years: 0.75    Types: Cigarettes   Smokeless tobacco: Never  Vaping Use   Vaping Use: Former  Substance and Sexual Activity   Alcohol use: No   Drug use: Yes    Types: Marijuana    Comment: stopped 11/21   Sexual activity: Yes    Birth control/protection: None    Comment: planning depo  Other Topics Concern   Not on file  Social History Narrative   ** Merged History Encounter **       9th grade 2014-2015   Right handed   Drinks caffeine   Lives in one story home      Currently [redacted] weeks pregnant   Social Determinants of Health   Financial Resource Strain: Not on file  Food Insecurity: Not on file  Transportation Needs: Not on file  Physical Activity: Not on file  Stress: Not on file  Social Connections: Not on file  Intimate Partner Violence: Not on file    Outpatient Medications Prior to Visit  Medication Sig Dispense Refill   cyproheptadine (PERIACTIN) 4 MG tablet Take 1 tablet (4 mg total) by mouth 3 (three) times daily. (Patient not taking: No sig reported) 90 tablet 1   ibuprofen (ADVIL) 600 MG tablet Take 1 tablet (600 mg total) by mouth every 6 (six) hours. (Patient not taking: No sig reported) 30 tablet 0   lamoTRIgine (LAMICTAL) 100 MG tablet Take 200 mg by  mouth daily.     medroxyPROGESTERone (DEPO-PROVERA) 150 MG/ML injection Inject 1 mL (150 mg total) into the muscle every 3 (three) months. 1 mL 4   Prenatal Vit-Fe Fumarate-FA (PRENATAL VITAMINS) 28-0.8 MG TABS Take by mouth.     propranolol ER (INDERAL LA) 80 MG 24 hr capsule Take 1 capsule (80 mg total) by mouth daily. 30 capsule 5   QUEtiapine (SEROQUEL) 300 MG tablet Take 1 tablet by mouth daily. (Patient not taking: Reported on 01/08/2021)     SUMAtriptan (IMITREX) 100 MG tablet Take 1 tablet earliest onset of migraine.  May repeat in 2 hours if headache persists or recurs.  Maximum 2 tablets in 24 hours. (Patient not taking: Reported on 09/10/2021) 10 tablet 5   No  facility-administered medications prior to visit.      ROS:  Review of Systems BREAST: No symptoms   OBJECTIVE:   Vitals:  There were no vitals taken for this visit.  Physical Exam  Results: No results found for this or any previous visit (from the past 24 hour(s)).   Assessment/Plan: No diagnosis found.    No orders of the defined types were placed in this encounter.     No follow-ups on file.  Steen Bisig B. Raynah Gomes, PA-C 10/04/2021 1:29 PM

## 2021-10-05 ENCOUNTER — Ambulatory Visit: Payer: Medicaid Other | Admitting: Obstetrics and Gynecology

## 2021-10-05 ENCOUNTER — Encounter: Payer: Self-pay | Admitting: Obstetrics and Gynecology

## 2021-10-05 VITALS — BP 108/70 | Ht 62.0 in | Wt 163.0 lb

## 2021-10-05 DIAGNOSIS — N912 Amenorrhea, unspecified: Secondary | ICD-10-CM | POA: Diagnosis not present

## 2021-10-05 DIAGNOSIS — Z113 Encounter for screening for infections with a predominantly sexual mode of transmission: Secondary | ICD-10-CM

## 2021-10-05 LAB — POCT URINE PREGNANCY: Preg Test, Ur: NEGATIVE

## 2021-12-22 ENCOUNTER — Telehealth: Payer: Self-pay | Admitting: Neurology

## 2021-12-22 ENCOUNTER — Other Ambulatory Visit: Payer: Self-pay

## 2021-12-22 DIAGNOSIS — G43009 Migraine without aura, not intractable, without status migrainosus: Secondary | ICD-10-CM

## 2021-12-22 MED ORDER — PROPRANOLOL HCL ER 80 MG PO CP24: 80.0000 mg | ORAL_CAPSULE | Freq: Every day | ORAL | 0 refills | Status: DC

## 2021-12-22 NOTE — Telephone Encounter (Signed)
Prescription has been sent called patient

## 2021-12-22 NOTE — Telephone Encounter (Signed)
1. Which medications need refilled? (List name and dosage, if known) propranolol  2. Which pharmacy/location is medication to be sent to? (include street and city if local pharmacy) [facility]

## 2022-02-01 ENCOUNTER — Encounter: Payer: Self-pay | Admitting: Obstetrics and Gynecology

## 2022-03-09 NOTE — Progress Notes (Signed)
NEUROLOGY FOLLOW UP OFFICE NOTE  Mary Crane [id number]  Assessment/Plan:   Migraine without aura, without status migrainosus, not intractable   Migraine prevention:  Propranolol ER to 80mg  daily Migraine rescue: Tylenol if needed.  Once her baby no longer nurses, we can try sumatriptan Limit use of pain relievers to no more than 2 days out of week to prevent risk of rebound or medication-overuse headache. Keep headache diary Follow up 1 year     Subjective:  Mary Crane is a 21 year old right-handed female who follows up for migraines.  Accompanied by her husband   UPDATE: She is breastfeeding.  Unable to use sumatriptan because her baby will not take a bottle.   Increased propranolol last visit.   Intensity:  severe Duration:  2 hours off an on all day with Tylenol Frequency:  2-3 in 30 days   Current analgesic:  Tylenol Current antihypertensive:  propranolol ER 80mg  daily Current antiepileptic:  Lamotrigine Current antihistamine:  none Current vitamins/supplements:  magnesium oxide 400mg , riboflavin 400mg  daily   HISTORY: History of headaches described as mild-moderate diffuse pounding headache with photophobia.  No nausea or visual symptoms.  They abort quickly with ibuprofen and would occur 1 to 2 a month.   Since pregnancy her headaches are more severe, diffuse pounding, eyes hurt, with photophobia but still no nausea, vomiting, visual disturbance, numbness or weakness.  They started mid-January, a couple of times a week, then February 4-5 times a week. In March she had 4-5 a month.  In April they have been daily.  They are a diffuse pounding headache.  Triggers/aggravating factors include coughing or taking a nap.  She takes Tylenol 3 to 5 days a week.  Fioricet ineffective.  She smokes 1/2 ppd or less.       Past analgesic:  Fioricet, ibuprofen, cyproheptadine  PAST MEDICAL HISTORY: Past Medical History:  Diagnosis Date   Abdominal  pain    Abdominal pain    Abnormal celiac antibody panel    Allergy    Anxiety    Bipolar depression (HCC)    Borderline personality disorder ([place])    Depression    Early satiety    Eczema    bilateral legs   Headache(784.0)    Nausea    Nausea    Panic disorder    Seasonal allergies    Vision abnormalities    Hx: of wears glasses    MEDICATIONS: Current Outpatient Medications on File Prior to Visit  Medication Sig Dispense Refill   lamoTRIgine (LAMICTAL) 100 MG tablet Take 200 mg by mouth daily.     Prenatal Vit-Fe Fumarate-FA (PRENATAL VITAMINS) 28-0.8 MG TABS Take by mouth.     propranolol ER (INDERAL LA) 80 MG 24 hr capsule Take 1 capsule (80 mg total) by mouth daily. 90 capsule 0   No current facility-administered medications on file prior to visit.    ALLERGIES: Allergies  Allergen Reactions   Azithromycin Anaphylaxis, Hives, Other (See Comments) and Itching    Trouble breathing Trouble breathing   Augmentin [Amoxicillin-Pot Clavulanate] Hives   Gluten Meal Nausea And Vomiting    FAMILY HISTORY: Family History  Problem Relation Age of Onset   Cholelithiasis Maternal Grandmother    Hypertension Maternal Grandmother    Hyperlipidemia Maternal Grandmother    Thyroid disease Maternal Grandmother    Cholelithiasis Maternal Grandfather    Hypertension Maternal Grandfather    Heart Problems Maternal Grandfather    Hyperlipidemia Mother  Thyroid disease Mother    Celiac disease Neg Hx       Objective:  Blood pressure 106/70, pulse 76, height 5\' 2"  (1.575 m), weight 164 lb (74.4 kg), SpO2 97 %, currently breastfeeding. General: No acute distress.  Patient appears well-groomed.     , DO

## 2022-03-14 ENCOUNTER — Ambulatory Visit: Payer: Medicaid Other | Admitting: Neurology

## 2022-03-14 ENCOUNTER — Encounter: Payer: Self-pay | Admitting: Neurology

## 2022-03-14 DIAGNOSIS — G43009 Migraine without aura, not intractable, without status migrainosus: Secondary | ICD-10-CM | POA: Diagnosis not present

## 2022-03-14 MED ORDER — PROPRANOLOL HCL ER 80 MG PO CP24: 80.0000 mg | ORAL_CAPSULE | Freq: Every day | ORAL | 3 refills | Status: DC

## 2022-04-08 ENCOUNTER — Encounter: Payer: Self-pay | Admitting: Obstetrics and Gynecology

## 2022-04-11 ENCOUNTER — Encounter: Payer: Self-pay | Admitting: Obstetrics and Gynecology

## 2022-04-11 MED ORDER — MEDROXYPROGESTERONE ACETATE 10 MG PO TABS: 10.0000 mg | ORAL_TABLET | Freq: Every day | ORAL | 0 refills | Status: DC

## 2022-06-14 ENCOUNTER — Encounter: Payer: Self-pay | Admitting: Obstetrics and Gynecology

## 2022-07-20 ENCOUNTER — Encounter: Payer: Self-pay | Admitting: Obstetrics and Gynecology

## 2022-07-20 DIAGNOSIS — N912 Amenorrhea, unspecified: Secondary | ICD-10-CM

## 2022-08-05 ENCOUNTER — Encounter: Payer: Self-pay | Admitting: Obstetrics and Gynecology

## 2022-08-05 ENCOUNTER — Other Ambulatory Visit: Payer: Self-pay

## 2022-08-05 ENCOUNTER — Other Ambulatory Visit: Payer: Medicaid Other

## 2022-08-05 DIAGNOSIS — N912 Amenorrhea, unspecified: Secondary | ICD-10-CM

## 2022-08-08 NOTE — Telephone Encounter (Signed)
I contacted the patient via phone. I left message for the patient to call back to be scheduled for a nurse visit for confirmation.

## 2023-03-14 NOTE — Progress Notes (Unsigned)
NEUROLOGY FOLLOW UP OFFICE NOTE  Mary Crane [id number]  Assessment/Plan:   Migraine without aura, without status migrainosus, not intractable   Migraine prevention:  Propranolol ER 80mg  daily *** Migraine rescue: Tylenol if needed.  Once her baby no longer nurses, we can try sumatriptan *** Limit use of pain relievers to no more than 2 days out of week to prevent risk of rebound or medication-overuse headache. Keep headache diary Follow up 1 year     Subjective:  Mary Crane is a 22 year old right-handed female who follows up for migraines.  Accompanied by her husband ***   UPDATE: She is breastfeeding *** Intensity:  severe Duration:  2 hours off an on all day with Tylenol *** Frequency:  2-3 in 30 days ***   Current analgesic:  Tylenol Current antihypertensive:  propranolol ER 80mg  daily Current antiepileptic:  Lamotrigine Current antihistamine:  none Current vitamins/supplements:  magnesium oxide 400mg , riboflavin 400mg  daily   HISTORY: History of headaches described as mild-moderate diffuse pounding headache with photophobia.  No nausea or visual symptoms.  They abort quickly with ibuprofen and would occur 1 to 2 a month.   Since pregnancy her headaches are more severe, diffuse pounding, eyes hurt, with photophobia but still no nausea, vomiting, visual disturbance, numbness or weakness.  They started mid-January, a couple of times a week, then February 4-5 times a week. In March she had 4-5 a month.  In April they have been daily.  They are a diffuse pounding headache.  Triggers/aggravating factors include coughing or taking a nap.  She takes Tylenol 3 to 5 days a week.  Fioricet ineffective.  She smokes 1/2 ppd or less.       Past analgesic:  Fioricet, ibuprofen, cyproheptadine  PAST MEDICAL HISTORY: Past Medical History:  Diagnosis Date   Abdominal pain    Abdominal pain    Abnormal celiac antibody panel    Allergy    Anxiety     Bipolar depression (HCC)    Borderline personality disorder (HCC)    Depression    Early satiety    Eczema    bilateral legs   Headache(784.0)    Nausea    Nausea    Panic disorder    Seasonal allergies    Vision abnormalities    Hx: of wears glasses    MEDICATIONS: Current Outpatient Medications on File Prior to Visit  Medication Sig Dispense Refill   acetaminophen (TYLENOL) 500 MG tablet Take 1,000 mg by mouth every 6 (six) hours as needed for headache.     lamoTRIgine (LAMICTAL) 100 MG tablet Take 200 mg by mouth daily.     medroxyPROGESTERone (PROVERA) 10 MG tablet Take 1 tablet (10 mg total) by mouth daily for 7 days. 7 tablet 0   propranolol ER (INDERAL LA) 80 MG 24 hr capsule Take 1 capsule (80 mg total) by mouth daily. 90 capsule 3   No current facility-administered medications on file prior to visit.    ALLERGIES: Allergies  Allergen Reactions   Azithromycin Anaphylaxis, Hives, Other (See Comments) and Itching    Trouble breathing Trouble breathing   Augmentin [Amoxicillin-Pot Clavulanate] Hives   Gluten Meal Nausea And Vomiting    FAMILY HISTORY: Family History  Problem Relation Age of Onset   Cholelithiasis Maternal Grandmother    Hypertension Maternal Grandmother    Hyperlipidemia Maternal Grandmother    Thyroid disease Maternal Grandmother    Cholelithiasis Maternal Grandfather    Hypertension Maternal Grandfather  Heart Problems Maternal Grandfather    Hyperlipidemia Mother    Thyroid disease Mother    Celiac disease Neg Hx       Objective:  *** General: No acute distress.  Patient appears well-groomed.   Head:  Normocephalic/atraumatic Neck:  Supple.  No paraspinal tenderness.  Full range of motion. Heart:  Regular rate and rhythm. Neuro:  Alert and oriented.  Speech fluent and not dysarthric.  Language intact.  CN II-XII intact.  Bulk and tone normal.  Muscle strength 5/5 throughout.  Deep tendon reflexes 2+ throughout.  Gait normal.   Romberg negative.   Shon Millet, DO

## 2023-03-15 ENCOUNTER — Ambulatory Visit: Payer: Medicaid Other | Admitting: Neurology

## 2023-03-15 ENCOUNTER — Encounter: Payer: Self-pay | Admitting: Neurology

## 2023-03-15 DIAGNOSIS — G43009 Migraine without aura, not intractable, without status migrainosus: Secondary | ICD-10-CM | POA: Diagnosis not present

## 2023-03-15 MED ORDER — PROPRANOLOL HCL ER 80 MG PO CP24
80.0000 mg | ORAL_CAPSULE | Freq: Every day | ORAL | 3 refills | Status: AC
Start: 2023-03-15 — End: ?

## 2023-03-15 NOTE — Patient Instructions (Signed)
Propranolol daily Tylenol as needed Follow up 1 year

## 2024-03-15 ENCOUNTER — Ambulatory Visit: Payer: Medicaid Other | Admitting: Neurology

## 2024-03-20 ENCOUNTER — Ambulatory Visit: Admitting: Neurology

## 2024-04-15 ENCOUNTER — Encounter: Payer: Self-pay | Admitting: Obstetrics and Gynecology

## 2024-04-15 ENCOUNTER — Other Ambulatory Visit: Payer: Self-pay | Admitting: Obstetrics and Gynecology

## 2024-04-15 DIAGNOSIS — B001 Herpesviral vesicular dermatitis: Secondary | ICD-10-CM

## 2024-04-15 MED ORDER — VALACYCLOVIR HCL 1 G PO TABS: 1000.0000 mg | ORAL_TABLET | Freq: Every day | ORAL | 0 refills | Status: AC

## 2024-04-15 NOTE — Progress Notes (Signed)
 Rx valtrex  1 g daily for cold sore prevention, appt due.

## 2024-05-13 ENCOUNTER — Encounter: Payer: Self-pay | Admitting: Obstetrics

## 2024-05-13 ENCOUNTER — Other Ambulatory Visit (HOSPITAL_COMMUNITY)
Admission: RE | Admit: 2024-05-13 | Discharge: 2024-05-13 | Disposition: A | Discharge status: Home / Self Care | Source: Ambulatory Visit | Attending: Obstetrics | Admitting: Obstetrics

## 2024-05-13 ENCOUNTER — Ambulatory Visit: Admitting: Obstetrics

## 2024-05-13 VITALS — BP 102/62 | HR 57 | Ht 62.0 in | Wt 123.0 lb

## 2024-05-13 DIAGNOSIS — Z113 Encounter for screening for infections with a predominantly sexual mode of transmission: Secondary | ICD-10-CM | POA: Insufficient documentation

## 2024-05-13 DIAGNOSIS — Z124 Encounter for screening for malignant neoplasm of cervix: Secondary | ICD-10-CM | POA: Insufficient documentation

## 2024-05-13 DIAGNOSIS — Z30432 Encounter for removal of intrauterine contraceptive device: Secondary | ICD-10-CM

## 2024-05-13 DIAGNOSIS — Z30018 Encounter for initial prescription of other contraceptives: Secondary | ICD-10-CM | POA: Insufficient documentation

## 2024-05-13 MED ORDER — ETONOGESTREL-ETHINYL ESTRADIOL 0.12-0.015 MG/24HR VA RING: VAGINAL_RING | VAGINAL | 3 refills | Status: AC

## 2024-05-13 NOTE — Progress Notes (Signed)
" ° ° °  GYNECOLOGY OFFICE PROCEDURE NOTE  Mary Crane is a 23 y.o. G1P1001 here for Mirena IUD removal.  She prefers a contraceptive method that gives her a regular period. She has no pain or other concerns with her IUD. She is also due for a Pap smear and would like routine STI testing.  Last pap smear:     Component Value Date/Time   DIAGPAP  04/06/2020 9077    - Negative for intraepithelial lesion or malignancy (NILM)   ADEQPAP  04/06/2020 0922    Satisfactory for evaluation; transformation zone component PRESENT.    Result Date Procedure Results Follow-ups  04/06/2020 Cytology - PAP Neisseria Gonorrhea: Negative Chlamydia: Negative Trichomonas: Negative Adequacy: Satisfactory for evaluation; transformation zone component PRESENT. Diagnosis: - Negative for intraepithelial lesion or malignancy (NILM) Comment: Normal Reference Range Trichomonas - Negative Comment: Normal Reference Ranger Chlamydia - Negative Comment: Normal Reference Range Neisseria Gonorrhea - Negative    Pelvic exam: normal external genitalia, vulva, vagina, cervix. IUD strings visible at cervical os. Pap collected.  Procedure Note: IUD Removal   Patient identified, informed consent performed, consent signed.  Patient was in the dorsal lithotomy position, a speculum was placed in the patient's vagina. The cervix was visualized.  The IUD was removed in its entirety. The strings of the IUD were grasped and pulled using ring forceps. Patient tolerated the procedure well.    We discussed other available methods of contraception. Mary Crane would like to start the Nuvaring. I reviewed proper usage, placement, explained that it can be removed for up to 2 hours and should be rinsed with cool water before replacing. We discussed that she can keep it in for 3 weeks at a time and then remove it for a week before replacing, or she can keep in for 4 weeks and then replace it immediately if she prefers to suppress  menstruation. Recommend a backup method for 2 weeks upon initiation.  1. Routine screening for STI (sexually transmitted infection) - Cytology - PAP  2. Screening for cervical cancer - Cytology - PAP  3. Encounter for IUD removal (Primary)  Follow up for annual exam or PRN  Gottlieb Zuercher M Sergi Gellner, CNM         "

## 2024-05-15 ENCOUNTER — Ambulatory Visit: Payer: Self-pay | Admitting: Obstetrics

## 2024-05-15 LAB — CYTOLOGY - PAP
Chlamydia: NEGATIVE
Comment: NEGATIVE
Comment: NEGATIVE
Comment: NORMAL
Diagnosis: NEGATIVE
Neisseria Gonorrhea: NEGATIVE
Trichomonas: NEGATIVE

## 2024-06-07 NOTE — Progress Notes (Unsigned)
 "  Virtual Visit via Video Note:   Consent was obtained for video visit:  Yes.   Answered questions that patient had about telehealth interaction:  Yes.   I discussed the limitations, risks, security and privacy concerns of performing an evaluation and management service by telemedicine. I also discussed with the patient that there may be a patient responsible charge related to this service. The patient expressed understanding and agreed to proceed.  Pt location: Home Physician Location: office Name of referring provider:  [facility] I connected with Mary Crane at patients initiation/request on 06/10/2024 at  4:10 PM EST by video enabled telemedicine application and verified that I am speaking with the correct person using two identifiers. Pt MRN:  [id number] Pt DOB:  12-22-2000 Video Participants:  Mary Crane  Assessment/Plan:   Migraine without aura, without status migrainosus, not intractable   Migraine prevention:  Propranolol  ER 80mg  daily  Migraine rescue: Tylenol  if needed.   Limit use of pain relievers to no more than 2 days out of week to prevent risk of rebound or medication-overuse headache. Keep headache diary Follow up 1 year     Subjective:  Mary Crane is a 24 year old right-handed female who follows up for migraines.  Accompanied by her husband    UPDATE: *** Intensity:  severe Duration:  2 hours off an on all day with Tylenol   Frequency:  1-2 times a month (occasionally 3 times a month)   Current analgesic:  Tylenol  Current antihypertensive:  propranolol  ER 80mg  daily Current antiepileptic:  Lamotrigine  200mg  daily, oxcarbazepeine 150mg  BID Current antihistamine:  none Current vitamins/supplements:  magnesium oxide 400mg , riboflavin 400mg  daily   HISTORY: History of headaches described as mild-moderate diffuse pounding headache with photophobia.  No nausea or visual symptoms.  They abort  quickly with ibuprofen  and would occur 1 to 2 a month.   Since pregnancy her headaches are more severe, diffuse pounding, eyes hurt, with photophobia but still no nausea, vomiting, visual disturbance, numbness or weakness.  They started mid-January, a couple of times a week, then February 4-5 times a week. In March she had 4-5 a month.  In April they have been daily.  They are a diffuse pounding headache.  Triggers/aggravating factors include coughing or taking a nap.  She takes Tylenol  3 to 5 days a week.  Fioricet ineffective.  She smokes 1/2 ppd or less.       Past analgesic:  Fioricet, ibuprofen , cyproheptadine   Past Medical History: Past Medical History:  Diagnosis Date   Abdominal pain    Abdominal pain    Abnormal celiac antibody panel    Allergy    Anxiety    Bipolar depression (HCC)    Borderline personality disorder (HCC)    Depression    Early satiety    Eczema    bilateral legs   Headache(784.0)    Nausea    Nausea    Normal labor 11/24/2020   Panic disorder    Postpartum care following vaginal delivery 11/26/2020   Seasonal allergies    Supervision of high risk pregnancy, antepartum 04/06/2020              Nursing Staff    Provider      Office Location     [facility]    Dating      9w US       Language     English    Anatomy US   complete, normal      Flu Vaccine      10/21    Genetic Screen     NIPS: neg x3, XX      TDaP vaccine      09/22/20    Hgb A1C or   GTT    Third trimester : 118      Rhogam      09/08/20         LAB RESULTS       Feeding Plan     breast     Blood Type    O/Negative   Vision abnormalities    Hx: of wears glasses    Medications: Outpatient Encounter Medications as of 06/10/2024  Medication Sig   acetaminophen  (TYLENOL ) 500 MG tablet Take 1,000 mg by mouth every 6 (six) hours as needed for headache.   etonogestrel -ethinyl estradiol  (NUVARING) 0.12-0.015 MG/24HR vaginal ring Insert vaginally and leave in place for 3 consecutive weeks, then remove  for 1 week.   gabapentin (NEURONTIN) 300 MG capsule Take by mouth.   lamoTRIgine  (LAMICTAL ) 100 MG tablet Take 200 mg by mouth daily. (Patient not taking: Reported on 05/13/2024)   OXcarbazepine (TRILEPTAL) 150 MG tablet Take 150 mg by mouth 2 (two) times daily. (Patient not taking: Reported on 05/13/2024)   propranolol  ER (INDERAL  LA) 80 MG 24 hr capsule Take 1 capsule (80 mg total) by mouth daily.   sertraline (ZOLOFT) 50 MG tablet Take by mouth.   valACYclovir  (VALTREX ) 1000 MG tablet Take 1 tablet (1,000 mg total) by mouth daily.   Vitamin D, Ergocalciferol, (DRISDOL) 1.25 MG (50000 UNIT) CAPS capsule Take 50,000 Units by mouth.   No facility-administered encounter medications on file as of 06/10/2024.    Allergies: Allergies[1]  Family History: Family History  Problem Relation Age of Onset   Cholelithiasis Maternal Grandmother    Hypertension Maternal Grandmother    Hyperlipidemia Maternal Grandmother    Thyroid disease Maternal Grandmother    Cholelithiasis Maternal Grandfather    Hypertension Maternal Grandfather    Heart Problems Maternal Grandfather    Hyperlipidemia Mother    Thyroid disease Mother    Celiac disease Neg Hx     Observations/Objective:   No acute distress.  Alert and oriented.  Speech fluent and not dysarthric.  Language intact.  Eyes orthophoric on primary gaze.  Face symmetric.   Follow up Instructions:      -I discussed the assessment and treatment plan with the patient. The patient was provided an opportunity to ask questions and all were answered. The patient agreed with the plan and demonstrated an understanding of the instructions.   The patient was advised to call back or seek an in-person evaluation if the symptoms worsen or if the condition fails to improve as anticipated.   Mary Lamar Dunnings, DO     [1]  Allergies Allergen Reactions   Azithromycin Anaphylaxis, Hives, Other (See Comments) and Itching    Trouble breathing Trouble  breathing   Augmentin [Amoxicillin-Pot Clavulanate] Hives   Gluten Meal Nausea And Vomiting   "

## 2024-06-10 ENCOUNTER — Telehealth: Admitting: Neurology

## 2024-07-01 ENCOUNTER — Ambulatory Visit: Admitting: Obstetrics

## 2024-07-10 ENCOUNTER — Ambulatory Visit: Admitting: Neurology
